# Patient Record
Sex: Female | Born: 1960 | Race: White | Hispanic: No | Marital: Married | Smoking: Never smoker
Health system: Southern US, Community
[De-identification: ages and names within clinical notes are randomized; demographics above are authoritative.]

## PROBLEM LIST (undated history)

## (undated) DIAGNOSIS — M069 Rheumatoid arthritis, unspecified: Secondary | ICD-10-CM

## (undated) DIAGNOSIS — F419 Anxiety disorder, unspecified: Secondary | ICD-10-CM

## (undated) DIAGNOSIS — M329 Systemic lupus erythematosus, unspecified: Secondary | ICD-10-CM

## (undated) DIAGNOSIS — IMO0002 Reserved for concepts with insufficient information to code with codable children: Secondary | ICD-10-CM

## (undated) DIAGNOSIS — E785 Hyperlipidemia, unspecified: Secondary | ICD-10-CM

## (undated) DIAGNOSIS — K589 Irritable bowel syndrome without diarrhea: Secondary | ICD-10-CM

## (undated) DIAGNOSIS — M797 Fibromyalgia: Secondary | ICD-10-CM

## (undated) DIAGNOSIS — I10 Essential (primary) hypertension: Secondary | ICD-10-CM

## (undated) DIAGNOSIS — T4145XA Adverse effect of unspecified anesthetic, initial encounter: Secondary | ICD-10-CM

## (undated) DIAGNOSIS — T8859XA Other complications of anesthesia, initial encounter: Secondary | ICD-10-CM

## (undated) DIAGNOSIS — G8929 Other chronic pain: Secondary | ICD-10-CM

## (undated) DIAGNOSIS — K219 Gastro-esophageal reflux disease without esophagitis: Secondary | ICD-10-CM

## (undated) DIAGNOSIS — D6861 Antiphospholipid syndrome: Secondary | ICD-10-CM

## (undated) DIAGNOSIS — M549 Dorsalgia, unspecified: Secondary | ICD-10-CM

## (undated) DIAGNOSIS — M3500 Sicca syndrome, unspecified: Secondary | ICD-10-CM

## (undated) DIAGNOSIS — G43909 Migraine, unspecified, not intractable, without status migrainosus: Secondary | ICD-10-CM

## (undated) DIAGNOSIS — Z8669 Personal history of other diseases of the nervous system and sense organs: Secondary | ICD-10-CM

## (undated) DIAGNOSIS — IMO0001 Reserved for inherently not codable concepts without codable children: Secondary | ICD-10-CM

## (undated) HISTORY — PX: CERVICAL CERCLAGE: SHX1329

---

## 1987-01-31 HISTORY — PX: CERVICAL CONIZATION W/BX: SHX1330

## 1998-01-19 ENCOUNTER — Other Ambulatory Visit: Admission: RE | Admit: 1998-01-19 | Discharge: 1998-01-19 | Payer: Self-pay | Admitting: *Deleted

## 1999-02-09 ENCOUNTER — Other Ambulatory Visit: Admission: RE | Admit: 1999-02-09 | Discharge: 1999-02-09 | Payer: Self-pay | Admitting: *Deleted

## 2000-02-14 ENCOUNTER — Other Ambulatory Visit: Admission: RE | Admit: 2000-02-14 | Discharge: 2000-02-14 | Payer: Self-pay | Admitting: *Deleted

## 2001-03-25 ENCOUNTER — Other Ambulatory Visit: Admission: RE | Admit: 2001-03-25 | Discharge: 2001-03-25 | Payer: Self-pay | Admitting: *Deleted

## 2002-04-10 ENCOUNTER — Other Ambulatory Visit: Admission: RE | Admit: 2002-04-10 | Discharge: 2002-04-10 | Payer: Self-pay | Admitting: *Deleted

## 2003-05-11 ENCOUNTER — Encounter (INDEPENDENT_AMBULATORY_CARE_PROVIDER_SITE_OTHER): Payer: Self-pay | Admitting: Specialist

## 2003-05-11 ENCOUNTER — Inpatient Hospital Stay (HOSPITAL_COMMUNITY): Admission: RE | Admit: 2003-05-11 | Discharge: 2003-05-13 | Payer: Self-pay | Admitting: Urology

## 2003-05-11 HISTORY — PX: OTHER SURGICAL HISTORY: SHX169

## 2004-05-06 ENCOUNTER — Ambulatory Visit: Payer: Self-pay | Admitting: Internal Medicine

## 2004-05-24 ENCOUNTER — Ambulatory Visit: Payer: Self-pay | Admitting: Internal Medicine

## 2004-08-16 ENCOUNTER — Emergency Department (HOSPITAL_COMMUNITY): Admission: EM | Admit: 2004-08-16 | Discharge: 2004-08-16 | Payer: Self-pay | Admitting: *Deleted

## 2004-10-04 ENCOUNTER — Other Ambulatory Visit: Admission: RE | Admit: 2004-10-04 | Discharge: 2004-10-04 | Payer: Self-pay | Admitting: Obstetrics and Gynecology

## 2004-12-17 ENCOUNTER — Encounter: Admission: RE | Admit: 2004-12-17 | Discharge: 2004-12-17 | Payer: Self-pay | Admitting: Otolaryngology

## 2006-02-13 ENCOUNTER — Emergency Department (HOSPITAL_COMMUNITY): Admission: EM | Admit: 2006-02-13 | Discharge: 2006-02-13 | Payer: Self-pay | Admitting: Emergency Medicine

## 2006-08-21 DIAGNOSIS — G4700 Insomnia, unspecified: Secondary | ICD-10-CM

## 2006-08-21 DIAGNOSIS — K219 Gastro-esophageal reflux disease without esophagitis: Secondary | ICD-10-CM

## 2007-07-10 ENCOUNTER — Ambulatory Visit (HOSPITAL_COMMUNITY): Admission: RE | Admit: 2007-07-10 | Discharge: 2007-07-10 | Payer: Self-pay | Admitting: Urology

## 2007-07-26 ENCOUNTER — Encounter (INDEPENDENT_AMBULATORY_CARE_PROVIDER_SITE_OTHER): Payer: Self-pay | Admitting: Urology

## 2007-07-26 ENCOUNTER — Ambulatory Visit (HOSPITAL_COMMUNITY): Admission: RE | Admit: 2007-07-26 | Discharge: 2007-07-26 | Payer: Self-pay | Admitting: Urology

## 2007-07-26 HISTORY — PX: OTHER SURGICAL HISTORY: SHX169

## 2008-11-05 ENCOUNTER — Encounter: Admission: RE | Admit: 2008-11-05 | Discharge: 2008-11-05 | Payer: Self-pay | Admitting: Obstetrics and Gynecology

## 2009-04-19 ENCOUNTER — Encounter (INDEPENDENT_AMBULATORY_CARE_PROVIDER_SITE_OTHER): Payer: Self-pay | Admitting: *Deleted

## 2009-05-12 ENCOUNTER — Encounter (INDEPENDENT_AMBULATORY_CARE_PROVIDER_SITE_OTHER): Payer: Self-pay | Admitting: *Deleted

## 2009-05-13 ENCOUNTER — Encounter (INDEPENDENT_AMBULATORY_CARE_PROVIDER_SITE_OTHER): Payer: Self-pay | Admitting: *Deleted

## 2009-05-13 ENCOUNTER — Ambulatory Visit: Payer: Self-pay | Admitting: Internal Medicine

## 2009-05-27 ENCOUNTER — Ambulatory Visit: Payer: Self-pay | Admitting: Internal Medicine

## 2009-05-31 ENCOUNTER — Encounter: Payer: Self-pay | Admitting: Internal Medicine

## 2010-03-01 NOTE — Letter (Signed)
Summary: Patient Notice- Polyp Results  [facility]  [address] [place], [place] [address]   Phone: [phone]  Fax: [phone]        May 31, 2009 MRN: [id number]    Sarah Sanford [address] [place], [place]  [address]    Dear 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  Should you develop new or worsening symptoms of abdominal pain, bowel habit changes or bleeding from the rectum or bowels, please schedule an evaluation with either your primary care physician or with me.  Additional information/recommendations:  _x_ No further action with gastroenterology is needed at this time. Please      follow-up with your primary care physician for your other healthcare      needs.  __ Please call [phone] to schedule a return visit to review your      situation.  __ Please keep your follow-up visit as already scheduled.  __ Continue treatment plan as outlined the day of your exam.  Please call us if you are having persistent problems or have questions about your condition that have not been fully answered at this time.  Sincerely,  Hart Carwin MD  This letter has been electronically signed by your physician.  Appended Document: Patient Notice- Polyp Results letter mailed 5.5.11

## 2010-03-01 NOTE — Letter (Signed)
Summary: Miralax Instructions  [facility]  [address]   [place], [place] [address]   Phone: [phone]  Fax: [phone]       Sarah Sanford    [birth year]    MRN: [id number]       Procedure Day Dorna Bloom: Thursday, 05-27-09     Arrival Time: 9:30 a.m.     Procedure Time: 10:30 a.m.     Location of Procedure:                    x    [facility] (4th Floor)   PREPARATION FOR COLONOSCOPY WITH MIRALAX  Starting 5 days prior to your procedure 05-22-09  do not eat nuts, seeds, popcorn, corn, beans, peas,  salads, or any raw vegetables.  Do not take any fiber supplements (e.g. Metamucil, Citrucel, and Benefiber). ____________________________________________________________________________________________________   THE DAY BEFORE YOUR PROCEDURE         DATE: 05-26-09   DAY: Wednesday  1   Drink clear liquids the entire day-NO SOLID FOOD  2   Do not drink anything colored red or purple.  Avoid juices with pulp.  No orange juice.  3   Drink at least 64 oz. (8 glasses) of fluid/clear liquids during the day to prevent dehydration and help the prep work efficiently.  CLEAR LIQUIDS INCLUDE: Water Jello Ice Popsicles Tea (sugar ok, no milk/cream) Powdered fruit flavored drinks Coffee (sugar ok, no milk/cream) Gatorade Juice: apple, white grape, white cranberry  Lemonade Clear bullion, consomm, broth Carbonated beverages (any kind) Strained chicken noodle soup Hard Candy  4   Mix the entire bottle of Miralax with 64 oz. of Gatorade/Powerade in the morning and put in the refrigerator to chill.  5   At 3:00 pm take 2 Dulcolax/Bisacodyl tablets.  6   At 4:30 pm take one Reglan/Metoclopramide tablet.  7  Starting at 5:00 pm drink one 8 oz glass of the Miralax mixture every 15-20 minutes until you have finished drinking the entire 64 oz.  You should finish drinking prep around 7:30 or 8:00 pm.  8   If you are nauseated, you may take the 2nd  Reglan/Metoclopramide tablet at 6:30 pm.        9    At 8:00 pm take 2 more DULCOLAX/Bisacodyl tablets.     THE DAY OF YOUR PROCEDURE      DATE:  05-27-09   DAY: Thursday  You may drink clear liquids until  8:30 a.m.   (2 HOURS BEFORE PROCEDURE).   MEDICATION INSTRUCTIONS  Unless otherwise instructed, you should take regular prescription medications with a small sip of water as early as possible the morning of your procedure.  Diabetic patients - see separate instructions.           OTHER INSTRUCTIONS  You will need a responsible adult at least 51 years of age to accompany you and drive you home.   This person must remain in the waiting room during your procedure.  Wear loose fitting clothing that is easily removed.  Leave jewelry and other valuables at home.  However, you may wish to bring a book to read or an iPod/MP3 player to listen to music as you wait for your procedure to start.  Remove all body piercing jewelry and leave at home.  Total time from sign-in until discharge is approximately 2-3 hours.  You should go home directly after your procedure and rest.  You can resume normal activities the day after your procedure.  The day of your procedure you should not:   Drive   Make legal decisions   Operate machinery   Drink alcohol   Return to work  You will receive specific instructions about eating, activities and medications before you leave.   The above instructions have been reviewed and explained to me by   Ezra Sites RN  May 13, 2009 9:31 AM     I fully understand and can verbalize these instructions _____________________________ Date _______

## 2010-03-01 NOTE — Miscellaneous (Signed)
Summary: [facility]  Clinical Lists Changes  Medications: Added new medication of MIRALAX   POWD (POLYETHYLENE GLYCOL 3350) As per prep  instructions. - Signed Added new medication of DULCOLAX 5 MG  TBEC (BISACODYL) Day before procedure take 2 at 3pm and 2 at 8pm. - Signed Added new medication of REGLAN 10 MG  TABS (METOCLOPRAMIDE HCL) As per prep instructions. - Signed Rx of MIRALAX   POWD (POLYETHYLENE GLYCOL 3350) As per prep  instructions.;  #255gm x 0;  Signed;  Entered by: Ezra Sites RN;  Authorized by: Hart Carwin MD;  Method used: Electronically to [facility] [phone]*, [address], [place], [place], [place]  [address], Ph: [phone], Fax: [phone] Rx of DULCOLAX 5 MG  TBEC (BISACODYL) Day before procedure take 2 at 3pm and 2 at 8pm.;  #4 x 0;  Signed;  Entered by: Ezra Sites RN;  Authorized by: Hart Carwin MD;  Method used: Electronically to [facility] [phone]*, [address] [place], [place], [place]  [address], Ph: [phone], Fax: [phone] Rx of REGLAN 10 MG  TABS (METOCLOPRAMIDE HCL) As per prep instructions.;  #2 x 0;  Signed;  Entered by: Ezra Sites RN;  Authorized by: Hart Carwin MD;  Method used: Electronically to [facility] [phone]*, [address], [place], [place], [place]  [address], Ph: [phone], Fax: [phone] Allergies: Changed allergy or adverse reaction from SULFA to SULFA Changed allergy or adverse reaction from TALWIN to TALWIN    Prescriptions: REGLAN 10 MG  TABS (METOCLOPRAMIDE HCL) As per prep instructions.  #2 x 0   Entered by:   Ezra Sites RN   Authorized by:   Hart Carwin MD   Signed by:   Ezra Sites RN on 05/13/2009   Method used:   Electronically to        [facility] [phone]* (retail)       [address]       [place], [place]  [address]       Ph: [phone]       Fax: [phone]   RxID:   [id number] DULCOLAX 5 MG  TBEC  (BISACODYL) Day before procedure take 2 at 3pm and 2 at 8pm.  #4 x 0   Entered by:   Ezra Sites RN   Authorized by:   Hart Carwin MD   Signed by:   Ezra Sites RN on 05/13/2009   Method used:   Electronically to        [facility] [phone]* (retail)       [address]       [place], [place]  [address]       Ph: [phone]       Fax: [phone]   RxID:   [id number] MIRALAX   POWD (POLYETHYLENE GLYCOL 3350) As per prep  instructions.  #255gm x 0   Entered by:   Ezra Sites RN   Authorized by:   Hart Carwin MD   Signed by:   Ezra Sites RN on 05/13/2009   Method used:   Electronically to        [facility] [phone]* (retail)       [address]       [place], [place]  [address]  Ph: [phone]       Fax: [phone]   RxID:   [id number]

## 2010-03-01 NOTE — Letter (Signed)
Summary: Colonoscopy Letter  [facility]  [address] [place], [place] [address]   Phone: [phone]  Fax: [phone]      April 19, 2009 MRN: [id number]   Sarah Sanford [address] [place], [place]  [address]   Dear Ms. Draeger,   According to your medical record, it is time for you to schedule a Colonoscopy. The American Cancer Society recommends this procedure as a method to detect early colon cancer. Patients with a family history of colon cancer, or a personal history of colon polyps or inflammatory bowel disease are at increased risk.  This letter has beeen generated based on the recommendations made at the time of your procedure. If you feel that in your particular situation this may no longer apply, please contact our office.  Please call our office at [phone] to schedule this appointment or to update your records at your earliest convenience.  Thank you for cooperating with [place] to provide you with the very best care possible.   Sincerely,  Hedwig Morton. Juanda Chance, M.D.  [facility] [phone]

## 2010-03-01 NOTE — Letter (Signed)
Summary: Diabetic Instructions  [facility]  [address] [place], [place] [address]   Phone: [phone]  Fax: [phone]    Sarah Sanford July 22, 1960 MRN: [id number]   _  _   ORAL DIABETIC MEDICATION INSTRUCTIONS  The day before your procedure:   Take your diabetic pill as you do normally  The day of your procedure:   Do not take your diabetic pill    We will check your blood sugar levels during the admission process and again in Recovery before discharging you home  ________________________________________________________________________

## 2010-03-01 NOTE — Procedures (Signed)
Summary: Colonoscopy  Patient: [name] Note: All result statuses are Final unless otherwise noted.  Tests: (1) Colonoscopy (COL)   COL Colonoscopy           DONE     [facility]     [address]     [place], [place]  [address]           COLONOSCOPY PROCEDURE REPORT           PATIENT:  Sarah Sanford, Sarah Sanford  MR#:  [id number]     BIRTHDATE:  [birth year], 49 yrs. old  GENDER:  female     ENDOSCOPIST:  Hedwig Morton. Juanda Chance, MD     REF. BY:  Dr Darlina Rumpf     PROCEDURE DATE:  05/27/2009     PROCEDURE:  Colonoscopy [address]     ASA CLASS:  Class I     INDICATIONS:  adenomatous polyp 2003, no polyp 2006     MEDICATIONS:   Versed 10 mg, Fentanyl 100 mcg           DESCRIPTION OF PROCEDURE:   After the risks benefits and     alternatives of the procedure were thoroughly explained, informed     consent was obtained.  Digital rectal exam was performed and     revealed no rectal masses.   The LB CF-[id number] endoscope     was introduced through the anus and advanced to the cecum, which     was identified by both the appendix and ileocecal valve, without     limitations.  The quality of the prep was good, using MiraLax.     The instrument was then slowly withdrawn as the colon was fully     examined.     <<PROCEDUREIMAGES>>           FINDINGS:  A sessile polyp was found. 4 mm sessile polyp at 55 cm     removed The polyp was removed using cold biopsy forceps (see     image3).  This was otherwise a normal examination of the colon     (see image1, image2, and image4).   Retroflexed views in the     rectum revealed no abnormalities.    The scope was then withdrawn     from the patient and the procedure completed.           COMPLICATIONS:  None     ENDOSCOPIC IMPRESSION:     1) Sessile polyp     2) Otherwise normal examination     RECOMMENDATIONS:     1) Await pathology results     2) High fiber diet.     REPEAT EXAM:  In 5 year(s) for.        ______________________________     Hedwig Morton. Juanda Chance, MD           CC:           n.     eSIGNED:   Hedwig Morton. Melynda Krzywicki at 05/27/2009 11:28 AM           Eloy End, [phone]  Note: An exclamation mark (!) indicates a result that was not dispersed into the flowsheet. Document Creation Date: 05/27/2009 11:29 AM _______________________________________________________________________  (1) Order result status: Final Collection or observation date-time: 05/27/2009 11:20 Requested date-time:  Receipt date-time:  Reported date-time:  Referring Physician:   Ordering Physician: Lina Sar [phone]) Specimen Source:  Source: Launa Grill Order Number: [id number] Lab site:   Appended Document: Colonoscopy  Procedures Next Due Date:    Colonoscopy: 05/2014

## 2010-04-19 LAB — GLUCOSE, CAPILLARY
Glucose-Capillary: 104 mg/dL — ABNORMAL HIGH (ref 70–99)
Glucose-Capillary: 137 mg/dL — ABNORMAL HIGH (ref 70–99)

## 2010-06-14 NOTE — Op Note (Signed)
Sarah Sanford, BILEK              ACCOUNT NO.:  [id number]   MEDICAL RECORD NO.:  [id number]          PATIENT TYPE:  AMB   LOCATION:  DAY                          FACILITY:  [facility]   PHYSICIAN:  Sigmund I. Patsi Sears, M.D.DATE OF BIRTH:  January 09, 1961   DATE OF PROCEDURE:  07/26/2007  DATE OF DISCHARGE:                               OPERATIVE REPORT   PREOPERATIVE DIAGNOSES:  Painful right periurethral mass.   POSTOPERATIVE DIAGNOSES:  Extrusion of right periurethral Mentor  transobturator tape.   OPERATION:  Vaginal exploration, excision of right periurethral Mentor  transobturator tape.   SURGEON:  Sigmund I. Patsi Sears, M.D.   ANESTHESIA:  General LMA.   PREPARATION:  After appropriate preanesthesia, the patient was brought  to the operating room, placed on the operating room table in the dorsal  supine position where general LMA anesthesia was induced.  She was then  replaced in dorsal lithotomy position where the pubis was prepped with  Betadine solution and draped in the usual fashion.   REVIEW OF HISTORY:  This 50 year old female, noted two weeks of fever of  100.5, suprapubic pain, microhematuria.  She was treated with Cipro but  cultures were negative.  The patient has not been sexually active, and  cannot comment on whether this causes pain.  She is status post Mentor  transobturator tape sling in 2005 for stress incontinence.  She has had  complete resolution of her incontinence.  She is a newly diagnosed  diabetic, with a history of clotting abnormality.  She is status post  MRI, which was negative.  Her vaginal examination, however, shows a 10-  point pain and a mass in the right periurethral area.  She is now for  exploration.   PROCEDURE:  The patient is placed in dorsal lithotomy position, and the  rectum appeared normal.  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  A wet 4 x 4  sponge was placed to protect the rectum, and a lubricated vaginal  speculum was placed.  The  patient was placed in the Trendelenburg  position, and Foley catheter was placed.  Vaginal examination revealed  the area of abnormality.  The vaginal epithelium appeared completely  normal.  There was no evidence of extrusion, however, the mass was  palpated, and felt to be scar in the right side of the urethra.  Four mL  of Xylocaine 1% with epinephrine 1:200,000 was then injected over the  area, and using 15 blade, a 2 cm ellipse was accomplished in the  periurethral area.  Dissection was then accomplished in the  subepithelial area, and the mass was identified.  This was grasped  with an Allis clamp, and excised.  No bleeding was noted.  Irrigation  was accomplished with double antibiotic irrigation.   Closure was accomplished with 2-0 Vicryl suture.  Repeat irrigation was  accomplished.  Cystoscopy was accomplished, and showed normal-appearing  bladder mucosa, with clear reflux from both orifices.  Urethra was  normal.  The bladder was drained of fluid.  The Foley catheter was not  replaced.  Vaginal packing was felt not to be necessary.  The  patient  was given IV Toradol.  She was given a B & O suppository at the  beginning of the  procedure.  She was awakened, and taken to recovery room in good  condition.  Note that the patient was previously marked on her right arm  prior to the case, and the patient received IV antibiotic prior to the  case.  In addition, the patient had time-out at the beginning of the  procedure, and again for the specimen, which was sent to pathology.      Sigmund I. Patsi Sears, M.D.  Electronically Signed     SIT/MEDQ  D:  07/26/2007  T:  07/26/2007  Job:  [id number]

## 2010-06-17 NOTE — Op Note (Signed)
NAME:  Sarah Sanford, Sarah Sanford                        ACCOUNT NO.:  [id number]   MEDICAL RECORD NO.:  [id number]                   PATIENT TYPE:  AMB   LOCATION:  DAY                                  FACILITY:  [facility]   PHYSICIAN:  Sigmund I. Patsi Sears, M.D.         DATE OF BIRTH:  07/03/1960   DATE OF PROCEDURE:  05/11/2003  DATE OF DISCHARGE:                                 OPERATIVE REPORT   PREOPERATIVE DIAGNOSIS:  Stress urinary incontinence.   POSTOPERATIVE DIAGNOSIS:  Stress urinary incontinence.   OPERATION:  Implantation of Mentor pubovaginal sling.   SURGEON:  Sigmund I. Patsi Sears, M.D.   ANESTHESIA:  General endotracheal.   PREPARATION:  After appropriate pre-anesthesia, the patient was brought to  the operating room and placed on the operative table in the dorsal supine  position where general endotracheal anesthesia was administered.  She  underwent a hysterectomy and bilateral salpingo-oophorectomy per Guice.  Following this procedure, with the patient in the dorsal lithotomy position,  the pubis was prepped and draped in the usual fashion.   The posterior weighted speculum was placed, and 10 cc of Marcaine 0.25 plain  was injected in the periurethral pubocervical fascial arch tissue.  A 1.5 cm  incision is then made over the urethra, subcutaneous tissue dissected with  sharp and blunt dissection.  Mild to moderate bleeding was noted from this  area.  It is noted that patient has a history of autoimmune coagulopathy and  had been pretreated with heparin.   Following dissection, measurements were taken 5 cm lateral to the clitoris  bilaterally, and area was marked.  Stab wounds were then made over the areas  marked, and the transvaginal, pubovaginal sling was placed through the  obturator fossa.  Tenting was obtained using a right inguinal clamp.  Cystoscopy revealed that there was no evidence of any sling within the  bladder, with both the 12 degree and the 70 degree  lenses.  The bladder was  drained of fluid with Foley catheter, and the wounds closed.  The  perivaginal incisions were closed with Dermabond.  The wound was closed in  two layers with 3-0 Vicryl suture.  Foley catheter was left in place and  vaginal packing was placed because of the history of coagulopathy.  The  patient was awakened and taken to the recovery room in good condition.                                               Sigmund I. Patsi Sears, M.D.   SIT/MEDQ  D:  05/11/2003  T:  05/11/2003  Job:  [id number]   cc:   Tracie Harrier, M.D.  [address] [place]  [place] [address]  Fax: [phone]

## 2010-06-17 NOTE — Op Note (Signed)
NAME:  Sarah Sanford, Sarah Sanford                        ACCOUNT NO.:  [id number]   MEDICAL RECORD NO.:  [id number]                   PATIENT TYPE:  OBV   LOCATION:  1610                                 FACILITY:  [facility]   PHYSICIAN:  Tracie Harrier, M.D.              DATE OF BIRTH:  06-24-1960   DATE OF PROCEDURE:  05/11/2003  DATE OF DISCHARGE:                                 OPERATIVE REPORT   PREOPERATIVE DIAGNOSES:  1. Abnormal uterine bleeding.  2. History of antiphospholipid syndrome.   POSTOPERATIVE DIAGNOSES:  1. Abnormal uterine bleeding.  2. History of antiphospholipid syndrome.   PROCEDURE:  Total vaginal hysterectomy and bilateral salpingo-oophorectomy.   SURGEON:  Tracie Harrier, M.D.   ASSISTANT:  Raynald Kemp, M.D.   ANESTHESIA:  General.   ESTIMATED BLOOD LOSS:  350 mL.   COMPLICATIONS:  None.   FINDINGS:  Normal pelvis.   DESCRIPTION OF PROCEDURE:  The patient was taken to the operating room where  a general endotracheal anesthetic was administered.  The patient was placed  on the operating table in the dorsal lithotomy position, the abdomen,  perineum and vagina was prepped and draped in the usual sterile fashion with  Betadine and sterile drapes.  A weighted speculum was placed and the cervix  was grasped with a Christella Hartigan tenaculum. The posterior cul-de-sac was sharply  and atraumatically entered.  The uterosacral ligaments were then clamped  bilaterally using the LigaSure device and created with cauterization with  the LigaSure device. These pedicles were then divided sharply. A  circumferential incision was made around the anterior portion of the cervix  and the bladder dissected cephalad. The anterior cul-de-sac was then entered  sharply and atraumatically.  A Deaver was placed around the bladder to  ensure its protection during the procedure.  Successive bites were then  carried up the body of the uterus using the LigaSure device. All vascular  pedicles  were thoroughly cauterized and divided sharply. Eventually when the  uterine ovarian ligaments were encountered the fundus of the uterus was  delivered through the posterior aspect of the uterus. The uterine ovarian  ligaments were cauterized once again with the LigaSure device and the uterus  dissected free.  The ovaries were then grasped with Heaney clamps and  retracted into the incision. The infundibulopelvic ligaments were then  grasped bilaterally using the LigaSure device and once again these were  cauterized thoroughly and the ovaries dissected free. Good hemostasis was  noted from the operative areas.  Attention was turned to closure.   The vagina and vaginal cuff was then attached to the uterosacral ligaments  bilaterally using a transfixing suture of #0 Vicryl.  The cul-de-sac was  obliterated by plicating the uterosacral ligament in the midline with a  suture of #0 Vicryl.  Good hemostasis was noted. The vaginal cuff was then  closed in an anterior to posterior fashion with multiple interrupted sutures  of #0 Vicryl. Good hemostasis was noted.  The vagina was closed completely.  The operative field was then prepared for Dr. Patsi Sears and his sling  procedure.   Estimated blood loss was 350 mL.  There were no perioperative complications.  Uterus and ovaries were visualized and noted to be normal. The patient did  receive a preoperative antibiotic.                                               Tracie Harrier, M.D.    REG/MEDQ  D:  05/12/2003  T:  05/12/2003  Job:  [id number]

## 2010-06-17 NOTE — H&P (Signed)
NAME:  Sarah Sanford, Sarah Sanford                        ACCOUNT NO.:  [id number]   MEDICAL RECORD NO.:  [id number]                   PATIENT TYPE:  AMB   LOCATION:  DAY                                  FACILITY:  [facility]   PHYSICIAN:  Tracie Harrier, M.D.              DATE OF BIRTH:  Apr 21, 1960   DATE OF ADMISSION:  DATE OF DISCHARGE:                                HISTORY & PHYSICAL   HISTORY OF PRESENT ILLNESS:  Sarah Sanford is a 50 year old female, gravida 2,  para 2, admitted for total vaginal hysterectomy and elective bilateral  salpingo-oophorectomy due to abnormal uterine bleeding. The patient has had  a long history of abnormal uterine bleeding and now wishes to have  definitive therapy for this. 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 She has been cleared by  her rheumatologist to proceed with this surgery. She declines a more  conservative treatment.   The patient also underwent evaluation urologically and will undergo a sling  procedure for stress urinary incontinence. A long discussion was undertaken  regarding oophorectomy and she requested to undergo oophorectomy. The risks  and benefits as well as alternative therapies discussed with her.   PAST MEDICAL HISTORY:  1. History of Sjogren's syndrome.  2. History of antiphospholipid syndrome.  3. History of cervical dysplasia.  4. History of an atypical colon polyp.   OBSTETRIC HISTORY:  Normal spontaneous vaginal delivery times two at term.   PAST SURGICAL HISTORY:  In 1989, cervical conization and cervical cerclage  times two.   CURRENT MEDICATIONS:  Zoloft, Nexium, Plaquenil, aspirin, Remifemin, and a  multivitamin.   ALLERGIES:  TALWIN, SULFA, and IODINE.   PHYSICAL EXAMINATION:  VITAL SIGNS: Stable, blood pressure 120/80.  GENERAL: She is a well-developed, well-nourished female in no acute  distress.  HEENT: Within normal limits.  NECK: Supple without  adenopathy or thyromegaly.  HEART: Regular rate and rhythm without murmurs, rubs, or gallops.  LUNGS: Clear.  BREASTS:  Exam done recently in the office was normal, however this is  deferred upon admission.  ABDOMEN: Soft and benign without masses, tenderness, hernia, or  organomegaly.  EXTREMITIES/NEUROLOGIC: Grossly normal.  PELVIC EXAM: Normal external female genitalia; vagina and cervix without  abnormal lesion. A small cystourethrocele is noted. The uterus is small,  nontender, and mobile. The adnexae are clear.   ADMISSION DIAGNOSES:  1. Abnormal uterine bleeding.  2. Stress urinary incontinence.   PLAN:  1. Total vaginal hysterectomy and bilateral salpingo-oophorectomy.  2. Followed by a sling procedure for stress urinary incontinence by Dr.     Patsi Sears.   DISCUSSION:  The risks and benefits of this procedure are discussed with the  patient. The risks of bleeding, infection, risks of injury to surrounding  organs are reviewed. Questions are answered regarding this surgery.  Alternative therapies are discussed as well.  Tracie Harrier, M.D.    REG/MEDQ  D:  05/10/2003  T:  05/10/2003  Job:  [id number]

## 2010-10-27 LAB — BASIC METABOLIC PANEL
BUN: 14
CO2: 25
Calcium: 9.3
Chloride: 104
Creatinine, Ser: 0.66
GFR calc Af Amer: >60
GFR calc non Af Amer: >60
Glucose, Bld: 123 — ABNORMAL HIGH
Potassium: 3.9
Sodium: 138

## 2010-10-27 LAB — HEMOGLOBIN AND HEMATOCRIT, BLOOD
HCT: 39.4
Hemoglobin: 13.6

## 2010-10-27 LAB — PROTIME-INR
INR: 1
Prothrombin Time: 13

## 2010-10-27 LAB — APTT: aPTT: 32

## 2010-11-03 ENCOUNTER — Ambulatory Visit (HOSPITAL_BASED_OUTPATIENT_CLINIC_OR_DEPARTMENT_OTHER)
Admission: RE | Admit: 2010-11-03 | Discharge: 2010-11-03 | Disposition: A | Discharge status: Home / Self Care | Payer: BC Managed Care – PPO | Source: Ambulatory Visit | Attending: Urology | Admitting: Urology

## 2010-11-03 ENCOUNTER — Other Ambulatory Visit: Payer: Self-pay | Admitting: Urology

## 2010-11-03 DIAGNOSIS — N8111 Cystocele, midline: Secondary | ICD-10-CM | POA: Insufficient documentation

## 2010-11-03 DIAGNOSIS — R3989 Other symptoms and signs involving the genitourinary system: Secondary | ICD-10-CM | POA: Insufficient documentation

## 2010-11-03 DIAGNOSIS — I10 Essential (primary) hypertension: Secondary | ICD-10-CM | POA: Insufficient documentation

## 2010-11-03 DIAGNOSIS — Z01812 Encounter for preprocedural laboratory examination: Secondary | ICD-10-CM | POA: Insufficient documentation

## 2010-11-03 DIAGNOSIS — IMO0001 Reserved for inherently not codable concepts without codable children: Secondary | ICD-10-CM | POA: Insufficient documentation

## 2010-11-03 DIAGNOSIS — K219 Gastro-esophageal reflux disease without esophagitis: Secondary | ICD-10-CM | POA: Insufficient documentation

## 2010-11-03 DIAGNOSIS — N816 Rectocele: Secondary | ICD-10-CM | POA: Insufficient documentation

## 2010-11-03 DIAGNOSIS — D6859 Other primary thrombophilia: Secondary | ICD-10-CM | POA: Insufficient documentation

## 2010-11-03 DIAGNOSIS — E119 Type 2 diabetes mellitus without complications: Secondary | ICD-10-CM | POA: Insufficient documentation

## 2010-11-03 HISTORY — PX: OTHER SURGICAL HISTORY: SHX169

## 2010-11-03 LAB — GLUCOSE, CAPILLARY: Glucose-Capillary: 115 mg/dL — ABNORMAL HIGH (ref 70–99)

## 2010-11-03 LAB — POCT I-STAT 4, (NA,K, GLUC, HGB,HCT)
Glucose, Bld: 144 mg/dL — ABNORMAL HIGH (ref 70–99)
HCT: 38 % (ref 36.0–46.0)
Hemoglobin: 12.9 g/dL (ref 12.0–15.0)
Potassium: 4 mEq/L (ref 3.5–5.1)
Sodium: 140 mEq/L (ref 135–145)

## 2010-11-04 NOTE — Op Note (Signed)
NAMEHARNOOR, Sarah              ACCOUNT NO.:  [id number]  MEDICAL RECORD NO.:  [id number]  LOCATION:                                 FACILITY:  PHYSICIAN:  Calani Gick I. Patsi Sears, M.D. DATE OF BIRTH:  DATE OF PROCEDURE: DATE OF DISCHARGE:                              OPERATIVE REPORT   PREOPERATIVE DIAGNOSIS:  Right periurethral pain.  POSTOPERATIVE DIAGNOSIS:  Right periurethral pain.  OPERATION:  Right periurethral exploration, excision of Mentor transobturator tape, and placement of Xenform.  SURGEON:  Cheyanna Strick I. Patsi Sears, MD  ANESTHESIA:  General LMA.  PREPARATION:  After appropriate preanesthesia, the patient was brought to the operating room, placed on the operating table in the dorsal supine position where general LMA anesthesia was introduced.  She was then replaced in dorsal lithotomy position __________ prepped with Betadine solution and draped in usual fashion.  BRIEF HISTORY:  Sarah Sanford is a 50 year old female status post Mentor transobturator tape placement for stress urinary incontinence in 2005. The patient did well until 2009, when she had vaginal exploration and removal of tape material.  The patient did well from 2009, but suddenly had onset of right periurethral pain, found on vaginal examination in 2012.  Examination in my office revealed point tenderness in the right periurethral area, in the same location as previous surgical excision of tape.  The patient is now for exploration of the right periurethral area.  Note her past history of antiphospholipid syndrome, the patient will be covered with Lovenox in the recovery room and will have alternating pressure stockings during the procedure.  PROCEDURE IN DETAIL:  With the patient in the dorsal lithotomy position, vaginal examination revealed grade 1 cystocele, grade 2 rectocele.  The urethra did not appear to be hypermobile, and was in normal neutral position.  Foley catheter was placed and clear  urine was obtained.  The vagina had normal estrogenization.  There were no atrophic changes. Examination of the urethra appeared to be normal and I did not palpate periurethral mass.  The Foley catheter was placed, and the bladder neck was marked with a blue pen.  Midurethra incision line was marked with a blue pen as well.  Marcaine 10 cc plus epinephrine 1:200,000 was then injected into the midline around the urethra and into the right periurethral space.  Using a 15 blade, a 2 cm incision was made in the midurethra, subcutaneous tissue was then dissected with the Strully scissors, with great care to avoid any injury to the urethra.  The tissue was dissected into the right lateral sulcus.  Bleeding was controlled with electrosurgical unit.  Using a headlight, I was able to dissect the tissue and find the transobturator tape band.  This was cut at the level of the urethra and then at the level of the obturator fossa.  Irrigation was accomplished.  The tissue was sent to laboratory. This appeared to be a tape material, and not mesh.  Following irrigation, I elected to place Xenform cadaveric/bovine tissue in the dissected space and 2 pieces of 1 cm x 1 square centimeters Xenform was placed in the dissected area.  I then closed the wound with a running 2- 0 Vicryl suture.  This  afforded some pressure inside the wound to stop small bleeding, and no bleeding was noted at the end of the procedure. A urethral inspection showed that the urethra was intact, but somewhat thinned.  I was able to recreate tissue around the urethra.  Packing with Estrace cream was placed, Foley catheter was replaced.  The packing will be removed in recovery room, but I will leave the Foley catheter for several days prior to voiding trial.  The patient was awakened, taken to recovery room, where she will receive subcutaneous injection of Lovenox.     Sarah Sanford I. Patsi Sears, M.D.     SIT/MEDQ  D:  11/03/2010   T:  11/03/2010  Job:  [id number]  Electronically Signed by Jethro Bolus M.D. on 11/04/2010 12:54:56 PM

## 2010-11-25 DIAGNOSIS — M810 Age-related osteoporosis without current pathological fracture: Secondary | ICD-10-CM | POA: Insufficient documentation

## 2010-11-25 DIAGNOSIS — M3500 Sicca syndrome, unspecified: Secondary | ICD-10-CM | POA: Insufficient documentation

## 2010-12-07 DIAGNOSIS — K760 Fatty (change of) liver, not elsewhere classified: Secondary | ICD-10-CM | POA: Insufficient documentation

## 2010-12-07 DIAGNOSIS — F329 Major depressive disorder, single episode, unspecified: Secondary | ICD-10-CM | POA: Insufficient documentation

## 2010-12-07 DIAGNOSIS — Z8679 Personal history of other diseases of the circulatory system: Secondary | ICD-10-CM | POA: Insufficient documentation

## 2010-12-07 DIAGNOSIS — E119 Type 2 diabetes mellitus without complications: Secondary | ICD-10-CM | POA: Insufficient documentation

## 2010-12-07 DIAGNOSIS — F32A Depression, unspecified: Secondary | ICD-10-CM | POA: Insufficient documentation

## 2010-12-07 DIAGNOSIS — Z8719 Personal history of other diseases of the digestive system: Secondary | ICD-10-CM | POA: Insufficient documentation

## 2011-03-07 ENCOUNTER — Other Ambulatory Visit (HOSPITAL_COMMUNITY): Payer: Self-pay | Admitting: Urology

## 2011-03-07 DIAGNOSIS — R102 Pelvic and perineal pain: Secondary | ICD-10-CM

## 2011-03-10 ENCOUNTER — Other Ambulatory Visit: Payer: Self-pay | Admitting: Urology

## 2011-03-13 ENCOUNTER — Inpatient Hospital Stay (HOSPITAL_COMMUNITY)
Admission: RE | Admit: 2011-03-13 | Discharge: 2011-03-13 | Payer: BC Managed Care – PPO | Source: Ambulatory Visit | Attending: Urology | Admitting: Urology

## 2011-03-14 ENCOUNTER — Ambulatory Visit (HOSPITAL_COMMUNITY)
Admission: RE | Admit: 2011-03-14 | Discharge: 2011-03-14 | Disposition: A | Discharge status: Home / Self Care | Payer: BC Managed Care – PPO | Source: Ambulatory Visit | Attending: Urology | Admitting: Urology

## 2011-03-14 DIAGNOSIS — N949 Unspecified condition associated with female genital organs and menstrual cycle: Secondary | ICD-10-CM | POA: Insufficient documentation

## 2011-03-14 DIAGNOSIS — K5730 Diverticulosis of large intestine without perforation or abscess without bleeding: Secondary | ICD-10-CM | POA: Insufficient documentation

## 2011-03-14 DIAGNOSIS — Z90710 Acquired absence of both cervix and uterus: Secondary | ICD-10-CM | POA: Insufficient documentation

## 2011-03-14 DIAGNOSIS — R102 Pelvic and perineal pain: Secondary | ICD-10-CM

## 2011-03-14 DIAGNOSIS — N8111 Cystocele, midline: Secondary | ICD-10-CM | POA: Insufficient documentation

## 2011-03-14 LAB — POCT I-STAT, CHEM 8
BUN: 24 mg/dL — ABNORMAL HIGH (ref 6–23)
Calcium, Ion: 1.27 mmol/L (ref 1.12–1.32)
Chloride: 105 mEq/L (ref 96–112)
Creatinine, Ser: 0.4 mg/dL — ABNORMAL LOW (ref 0.50–1.10)
Glucose, Bld: 157 mg/dL — ABNORMAL HIGH (ref 70–99)
HCT: 41 % (ref 36.0–46.0)
Potassium: 3.9 mEq/L (ref 3.5–5.1)
Sodium: 141 mEq/L (ref 135–145)
TCO2: 25 mmol/L (ref 0–100)

## 2011-03-14 MED ORDER — GADOBENATE DIMEGLUMINE 529 MG/ML IV SOLN
20.0000 mL | Freq: Once | INTRAVENOUS | Status: AC | PRN
  Administered 2011-03-14: 17 mL via INTRAVENOUS

## 2011-03-24 ENCOUNTER — Encounter (HOSPITAL_BASED_OUTPATIENT_CLINIC_OR_DEPARTMENT_OTHER): Payer: Self-pay | Admitting: *Deleted

## 2011-03-27 ENCOUNTER — Encounter (HOSPITAL_BASED_OUTPATIENT_CLINIC_OR_DEPARTMENT_OTHER): Payer: Self-pay | Admitting: *Deleted

## 2011-03-27 NOTE — Progress Notes (Signed)
NPO AFTER MN. ARRIVES AT 0915. NEEDS ISTAT AND EKG. WILL TAKE PLAQUINEL PRILOSEC, AND LIPITOR AM OF SURG W/ SIP OF WATER.

## 2011-03-30 ENCOUNTER — Ambulatory Visit (HOSPITAL_BASED_OUTPATIENT_CLINIC_OR_DEPARTMENT_OTHER): Payer: BC Managed Care – PPO | Admitting: Anesthesiology

## 2011-03-30 ENCOUNTER — Ambulatory Visit (HOSPITAL_BASED_OUTPATIENT_CLINIC_OR_DEPARTMENT_OTHER)
Admission: RE | Admit: 2011-03-30 | Discharge: 2011-03-30 | Disposition: A | Discharge status: Home Health | Payer: BC Managed Care – PPO | Source: Ambulatory Visit | Attending: Urology | Admitting: Urology

## 2011-03-30 ENCOUNTER — Encounter (HOSPITAL_BASED_OUTPATIENT_CLINIC_OR_DEPARTMENT_OTHER): Payer: Self-pay | Admitting: Anesthesiology

## 2011-03-30 ENCOUNTER — Encounter (HOSPITAL_BASED_OUTPATIENT_CLINIC_OR_DEPARTMENT_OTHER)
Admission: RE | Disposition: A | Discharge status: Home Health | Payer: Self-pay | Source: Ambulatory Visit | Attending: Urology

## 2011-03-30 ENCOUNTER — Encounter (HOSPITAL_BASED_OUTPATIENT_CLINIC_OR_DEPARTMENT_OTHER): Payer: Self-pay | Admitting: *Deleted

## 2011-03-30 DIAGNOSIS — N949 Unspecified condition associated with female genital organs and menstrual cycle: Secondary | ICD-10-CM | POA: Insufficient documentation

## 2011-03-30 DIAGNOSIS — N898 Other specified noninflammatory disorders of vagina: Secondary | ICD-10-CM | POA: Insufficient documentation

## 2011-03-30 DIAGNOSIS — Z79899 Other long term (current) drug therapy: Secondary | ICD-10-CM | POA: Insufficient documentation

## 2011-03-30 DIAGNOSIS — Z9889 Other specified postprocedural states: Secondary | ICD-10-CM | POA: Insufficient documentation

## 2011-03-30 DIAGNOSIS — G8929 Other chronic pain: Secondary | ICD-10-CM

## 2011-03-30 DIAGNOSIS — Z90710 Acquired absence of both cervix and uterus: Secondary | ICD-10-CM | POA: Insufficient documentation

## 2011-03-30 DIAGNOSIS — Z7982 Long term (current) use of aspirin: Secondary | ICD-10-CM | POA: Insufficient documentation

## 2011-03-30 DIAGNOSIS — M069 Rheumatoid arthritis, unspecified: Secondary | ICD-10-CM | POA: Insufficient documentation

## 2011-03-30 DIAGNOSIS — D6859 Other primary thrombophilia: Secondary | ICD-10-CM | POA: Insufficient documentation

## 2011-03-30 DIAGNOSIS — M3500 Sicca syndrome, unspecified: Secondary | ICD-10-CM | POA: Insufficient documentation

## 2011-03-30 DIAGNOSIS — K219 Gastro-esophageal reflux disease without esophagitis: Secondary | ICD-10-CM | POA: Insufficient documentation

## 2011-03-30 DIAGNOSIS — E119 Type 2 diabetes mellitus without complications: Secondary | ICD-10-CM | POA: Insufficient documentation

## 2011-03-30 HISTORY — DX: Dorsalgia, unspecified: M54.9

## 2011-03-30 HISTORY — DX: Systemic lupus erythematosus, unspecified: M32.9

## 2011-03-30 HISTORY — DX: Essential (primary) hypertension: I10

## 2011-03-30 HISTORY — DX: Personal history of other diseases of the nervous system and sense organs: Z86.69

## 2011-03-30 HISTORY — DX: Reserved for inherently not codable concepts without codable children: IMO0001

## 2011-03-30 HISTORY — DX: Other complications of anesthesia, initial encounter: T88.59XA

## 2011-03-30 HISTORY — DX: Adverse effect of unspecified anesthetic, initial encounter: T41.45XA

## 2011-03-30 HISTORY — PX: EXAMINATION UNDER ANESTHESIA: SHX1540

## 2011-03-30 HISTORY — DX: Hyperlipidemia, unspecified: E78.5

## 2011-03-30 HISTORY — DX: Reserved for concepts with insufficient information to code with codable children: IMO0002

## 2011-03-30 HISTORY — DX: Fibromyalgia: M79.7

## 2011-03-30 HISTORY — DX: Other chronic pain: G89.29

## 2011-03-30 HISTORY — DX: Sjogren syndrome, unspecified: M35.00

## 2011-03-30 HISTORY — DX: Gastro-esophageal reflux disease without esophagitis: K21.9

## 2011-03-30 HISTORY — DX: Rheumatoid arthritis, unspecified: M06.9

## 2011-03-30 HISTORY — DX: Irritable bowel syndrome, unspecified: K58.9

## 2011-03-30 HISTORY — DX: Anxiety disorder, unspecified: F41.9

## 2011-03-30 HISTORY — DX: Migraine, unspecified, not intractable, without status migrainosus: G43.909

## 2011-03-30 HISTORY — DX: Antiphospholipid syndrome: D68.61

## 2011-03-30 SURGERY — EXAM UNDER ANESTHESIA
Anesthesia: General | Site: Vagina | Wound class: Clean Contaminated

## 2011-03-30 MED ORDER — LACTATED RINGERS IV SOLN
INTRAVENOUS | Status: DC
  Administered 2011-03-30: 10:00:00 via INTRAVENOUS

## 2011-03-30 MED ORDER — SODIUM CHLORIDE 0.9 % IR SOLN
Status: DC | PRN
  Administered 2011-03-30: 11:00:00

## 2011-03-30 MED ORDER — ENOXAPARIN SODIUM 40 MG/0.4ML ~~LOC~~ SOLN
40.0000 mg | Freq: Two times a day (BID) | SUBCUTANEOUS | Status: AC
  Administered 2011-03-30: 40 mg via SUBCUTANEOUS

## 2011-03-30 MED ORDER — FENTANYL CITRATE 0.05 MG/ML IJ SOLN
INTRAMUSCULAR | Status: DC | PRN
  Administered 2011-03-30: 50 ug via INTRAVENOUS
  Administered 2011-03-30 (×2): 25 ug via INTRAVENOUS

## 2011-03-30 MED ORDER — MIDAZOLAM HCL 5 MG/5ML IJ SOLN
INTRAMUSCULAR | Status: DC | PRN
  Administered 2011-03-30: 2 mg via INTRAVENOUS

## 2011-03-30 MED ORDER — DEXAMETHASONE SODIUM PHOSPHATE 4 MG/ML IJ SOLN
INTRAMUSCULAR | Status: DC | PRN
  Administered 2011-03-30: 8 mg via INTRAVENOUS

## 2011-03-30 MED ORDER — BUPIVACAINE-EPINEPHRINE 0.5% -1:200000 IJ SOLN
INTRAMUSCULAR | Status: DC | PRN
  Administered 2011-03-30: 15 mg

## 2011-03-30 MED ORDER — TRIAMCINOLONE ACETONIDE 40 MG/ML IJ SUSP
INTRAMUSCULAR | Status: DC | PRN
  Administered 2011-03-30: 40 mg via INTRAMUSCULAR

## 2011-03-30 MED ORDER — ENOXAPARIN SODIUM 40 MG/0.4ML ~~LOC~~ SOLN: 40.0000 mg | Freq: Once | SUBCUTANEOUS | Status: DC

## 2011-03-30 MED ORDER — KETOROLAC TROMETHAMINE 30 MG/ML IJ SOLN: 15.0000 mg | Freq: Once | INTRAMUSCULAR | Status: DC | PRN

## 2011-03-30 MED ORDER — PROPOFOL 10 MG/ML IV EMUL
INTRAVENOUS | Status: DC | PRN
  Administered 2011-03-30: 170 mg via INTRAVENOUS

## 2011-03-30 MED ORDER — ONDANSETRON HCL 4 MG/2ML IJ SOLN
INTRAMUSCULAR | Status: DC | PRN
  Administered 2011-03-30: 4 mg via INTRAVENOUS

## 2011-03-30 MED ORDER — PROMETHAZINE HCL 25 MG/ML IJ SOLN: 6.2500 mg | INTRAMUSCULAR | Status: DC | PRN

## 2011-03-30 MED ORDER — DROPERIDOL 2.5 MG/ML IJ SOLN
INTRAMUSCULAR | Status: DC | PRN
  Administered 2011-03-30: 0.625 mg via INTRAVENOUS

## 2011-03-30 MED ORDER — CEFAZOLIN SODIUM 1-5 GM-% IV SOLN
1.0000 g | INTRAVENOUS | Status: AC
  Administered 2011-03-30: 1 g via INTRAVENOUS

## 2011-03-30 MED ORDER — HYDROCODONE-ACETAMINOPHEN 7.5-650 MG PO TABS: 1.0000 | ORAL_TABLET | Freq: Four times a day (QID) | ORAL | Status: AC | PRN

## 2011-03-30 MED ORDER — FENTANYL CITRATE 0.05 MG/ML IJ SOLN: 25.0000 ug | INTRAMUSCULAR | Status: DC | PRN

## 2011-03-30 MED ORDER — LIDOCAINE HCL (CARDIAC) 20 MG/ML IV SOLN
INTRAVENOUS | Status: DC | PRN
  Administered 2011-03-30: 80 mg via INTRAVENOUS

## 2011-03-30 SURGICAL SUPPLY — 58 items
ADH SKN CLS APL DERMABOND .7 (GAUZE/BANDAGES/DRESSINGS)
BAG URINE DRAINAGE (UROLOGICAL SUPPLIES) ×3 IMPLANT
BLADE SURG 10 STRL SS (BLADE) ×3 IMPLANT
BLADE SURG 15 STRL LF DISP TIS (BLADE) ×2 IMPLANT
BLADE SURG 15 STRL SS (BLADE) ×3
BLADE SURG ROTATE 9660 (MISCELLANEOUS) ×1 IMPLANT
BOOTIES KNEE HIGH SLOAN (MISCELLANEOUS) ×1 IMPLANT
CANISTER SUCTION 1200CC (MISCELLANEOUS) IMPLANT
CANISTER SUCTION 2500CC (MISCELLANEOUS) ×4 IMPLANT
CATH FOLEY 2WAY SLVR  5CC 16FR (CATHETERS) ×1
CATH FOLEY 2WAY SLVR 5CC 16FR (CATHETERS) ×2 IMPLANT
CLOTH BEACON ORANGE TIMEOUT ST (SAFETY) ×3 IMPLANT
COVER MAYO STAND STRL (DRAPES) ×3 IMPLANT
DERMABOND ADVANCED (GAUZE/BANDAGES/DRESSINGS)
DERMABOND ADVANCED .7 DNX12 (GAUZE/BANDAGES/DRESSINGS) IMPLANT
DISSECTOR BLUNT TIP ENDO 5MM (MISCELLANEOUS) IMPLANT
DISSECTOR ROUND CHERRY 3/8 STR (MISCELLANEOUS) ×1 IMPLANT
DRAIN PENROSE 18X1/2 LTX STRL (DRAIN) IMPLANT
DRAPE CAMERA CLOSED 9X96 (DRAPES) ×3 IMPLANT
DRAPE UNDERBUTTOCKS STRL (DRAPE) ×3 IMPLANT
ELECT BLADE 6.5 .24CM SHAFT (ELECTRODE) ×2 IMPLANT
FLOSEAL 10ML (HEMOSTASIS) IMPLANT
GLOVE BIO SURGEON STRL SZ 6.5 (GLOVE) ×2 IMPLANT
GLOVE BIO SURGEON STRL SZ7.5 (GLOVE) ×5 IMPLANT
GLOVE BIOGEL PI IND STRL 7.5 (GLOVE) ×1 IMPLANT
GLOVE BIOGEL PI INDICATOR 7.5 (GLOVE) ×1
GLOVE ECLIPSE 6.5 STRL STRAW (GLOVE) ×2 IMPLANT
GLOVE ECLIPSE 7.5 STRL STRAW (GLOVE) ×2 IMPLANT
GOWN PREVENTION PLUS LG XLONG (DISPOSABLE) ×3 IMPLANT
GOWN STRL REIN XL XLG (GOWN DISPOSABLE) ×5 IMPLANT
NDL SAFETY ECLIPSE 18X1.5 (NEEDLE) ×1 IMPLANT
NEEDLE HYPO 18GX1.5 SHARP (NEEDLE) ×3
NEEDLE HYPO 22GX1.5 SAFETY (NEEDLE) ×4 IMPLANT
PACKING VAGINAL (PACKING) ×3 IMPLANT
PENCIL BUTTON HOLSTER BLD 10FT (ELECTRODE) ×3 IMPLANT
PLUG CATH AND CAP STER (CATHETERS) ×3 IMPLANT
RETRACTOR LONRSTAR 16.6X16.6CM (MISCELLANEOUS) ×1 IMPLANT
RETRACTOR STAY HOOK 5MM (MISCELLANEOUS) ×1 IMPLANT
RETRACTOR STER APS 16.6X16.6CM (MISCELLANEOUS)
SET IRRIG Y TYPE TUR BLADDER L (SET/KITS/TRAYS/PACK) ×3 IMPLANT
SHEET LAVH (DRAPES) ×3 IMPLANT
SLING SOLYX SYSTEM SIS BX (SLING) IMPLANT
SPONGE LAP 18X18 X RAY DECT (DISPOSABLE) IMPLANT
SPONGE LAP 4X18 X RAY DECT (DISPOSABLE) ×2 IMPLANT
SUCTION FRAZIER TIP 10 FR DISP (SUCTIONS) ×3 IMPLANT
SUT ETHILON 2 0 PS N (SUTURE) IMPLANT
SUT MON AB 2-0 SH 27 (SUTURE)
SUT MON AB 2-0 SH27 (SUTURE) IMPLANT
SUT SILK 3 0 PS 1 (SUTURE) ×3 IMPLANT
SUT VIC AB 2-0 UR6 27 (SUTURE) ×6 IMPLANT
SYR 20CC LL (SYRINGE) ×2 IMPLANT
SYR BULB IRRIGATION 50ML (SYRINGE) ×3 IMPLANT
SYR CONTROL 10ML LL (SYRINGE) ×4 IMPLANT
SYRINGE 10CC LL (SYRINGE) ×3 IMPLANT
TRAY DSU PREP LF (CUSTOM PROCEDURE TRAY) ×3 IMPLANT
TUBE CONNECTING 12X1/4 (SUCTIONS) ×6 IMPLANT
WATER STERILE IRR 500ML POUR (IV SOLUTION) ×4 IMPLANT
YANKAUER SUCT BULB TIP NO VENT (SUCTIONS) IMPLANT

## 2011-03-30 NOTE — Op Note (Signed)
Pre-operative diagnosis : Vaginal pain, with history of Mentor transocular tape extrusion with removal.   Postoperative diagnosis: Same plus vaginal scar  Operation: Examination under anesthesia, incision of vaginal scar, injection of Marcaine 0.25 with epinephrine 1 200,000, and Kenalog in the right periurethral and pubis  Surgeon:  S. Patsi Sears, MD  First assistant: None  Anesthesia:  Gen. LMA  Preparation: After appropriate preanesthesia, and with the patient's armband checked, the patient was brought to the operating room, and placed on the operating table in the dorsal supine position, where general LMA anesthesia was introduced. She was previously given Lovenox, 40 mg, because of her history of antiphospholipid syndrome. She was prepped with soap solution, because of her history of allergy to iodine.  Review history: The patient is status post Mentor transocular tape in 2006 4 stress urinary continence. More recently, the patient had extrusion of mesh, and the mesh was removed. However, the patient has continued to have right-sided pubic pain, and vaginal pain. Inspection the office revealed a small blood clot in the right paravaginal and periurethral area, which could not be removed with a Q-tip. She has since passed this small clot, and is now for examination under anesthesia with the idea that she may have more extruded mesh.  Statement of  Likelihood of Success: Excellent. TIME-OUT observed.:  Procedure: Vaginal speculum was placed after Foley catheter was placed. No extrusion of mesh was identified. The previously identified vaginal abnormality with a clot, was gone. (Patient past clot subsequent to office visit in (. She does have scar within the vagina, and tenting of the scar was thought to possibly produce vaginal pain. Therefore, the scar was incised in 3 areas, and into the scar were cauterized for bleeding. No suture was used. The area of discomfort, as identified in the office,  was injected with Marcaine, epinephrine, and steroid. The vaginal tissue appeared healthy, and no masses were identified. She was awakened, taken to recovery room in good condition.

## 2011-03-30 NOTE — Transfer of Care (Signed)
  Immediate Anesthesia Transfer of Care Note  Patient: Sarah Sanford  Procedure(s) Performed: Procedure(s) (LRB): EXAM UNDER ANESTHESIA (N/A)  Patient Location: PACU  Anesthesia Type: General  Level of Consciousness: awake, oriented, sedated and patient cooperative  Airway & Oxygen Therapy: Patient Spontanous Breathing and Patient connected to face mask oxygen  Post-op Assessment: Report given to PACU RN and Post -op Vital signs reviewed and stable  Post vital signs: Reviewed and stable  Complications: No apparent anesthesia complications

## 2011-03-30 NOTE — Progress Notes (Signed)
Dr Patsi Sears notified of pt allergic to D/C pain med script, to call in new pain med without tylenol to pt's pharmacy

## 2011-03-30 NOTE — Anesthesia Procedure Notes (Signed)
Procedure Name: LMA Insertion Date/Time: 03/30/2011 10:33 AM Performed by: Renella Cunas D Pre-anesthesia Checklist: Patient identified, Emergency Drugs available, Suction available and Patient being monitored Patient Re-evaluated:Patient Re-evaluated prior to inductionOxygen Delivery Method: Circle System Utilized Preoxygenation: Pre-oxygenation with 100% oxygen Intubation Type: IV induction Ventilation: Mask ventilation without difficulty LMA: LMA inserted LMA Size: 4.0 Number of attempts: 1 Airway Equipment and Method: bite block Placement Confirmation: positive ETCO2 Tube secured with: Tape Dental Injury: Teeth and Oropharynx as per pre-operative assessment

## 2011-03-30 NOTE — Anesthesia Preprocedure Evaluation (Signed)
Anesthesia Evaluation  Patient identified by MRN, date of birth, ID band Patient awake    Reviewed: Allergy & Precautions, H&P , NPO status , Patient's Chart, lab work & pertinent test results  History of Anesthesia Complications (+) PROLONGED EMERGENCE  Airway Mallampati: II TM Distance: <3 FB Neck ROM: Full    Dental No notable dental hx.    Pulmonary neg pulmonary ROS,  clear to auscultation  Pulmonary exam normal       Cardiovascular hypertension, Regular Normal    Neuro/Psych Negative Neurological ROS  Negative Psych ROS   GI/Hepatic Neg liver ROS, GERD-  ,  Endo/Other  Negative Endocrine ROSDiabetes mellitus-  Renal/GU negative Renal ROS  Genitourinary negative   Musculoskeletal negative musculoskeletal ROS (+) Fibromyalgia -  Abdominal   Peds negative pediatric ROS (+)  Hematology negative hematology ROS (+)   Anesthesia Other Findings   Reproductive/Obstetrics negative OB ROS                           Anesthesia Physical Anesthesia Plan  ASA: II  Anesthesia Plan: General   Post-op Pain Management:    Induction: Intravenous  Airway Management Planned: LMA  Additional Equipment:   Intra-op Plan:   Post-operative Plan:   Informed Consent: I have reviewed the patients History and Physical, chart, labs and discussed the procedure including the risks, benefits and alternatives for the proposed anesthesia with the patient or authorized representative who has indicated his/her understanding and acceptance.   Dental advisory given  Plan Discussed with: CRNA  Anesthesia Plan Comments:         Anesthesia Quick Evaluation

## 2011-03-30 NOTE — Discharge Instructions (Signed)
Abdominal Pain, Women       Abdominal (stomach, pelvic, or belly) pain can be caused by many things. It is important to tell your doctor:   The location of the pain.   Does it come and go or is it present all the time?   Are there things that start the pain (eating certain foods, exercise)?   Are there other symptoms associated with the pain (fever, nausea, vomiting, diarrhea)?  All of this is helpful to know when trying to find the cause of the pain.   CAUSES   Stomach: virus or bacteria infection, or ulcer.   Intestine: appendicitis (inflamed appendix), regional ileitis (Crohn's disease), ulcerative colitis (inflamed colon), irritable bowel syndrome, diverticulitis (inflamed diverticulum of the colon), or cancer of the stomach or intestine.   Gallbladder disease or stones in the gallbladder.   Kidney disease, kidney stones, or infection.   Pancreas infection or cancer.   Fibromyalgia (pain disorder).   Diseases of the female organs:   Uterus: fibroid (non-cancerous) tumors or infection.   Fallopian tubes: infection or tubal pregnancy.   Ovary: cysts or tumors.   Pelvic adhesions (scar tissue).   Endometriosis (uterus lining tissue growing in the pelvis and on the pelvic organs).   Pelvic congestion syndrome (female organs filling up with blood just before the menstrual period).   Pain with the menstrual period.   Pain with ovulation (producing an egg).   Pain with an IUD (intrauterine device, birth control) in the uterus.   Cancer of the female organs.   Functional pain (pain not caused by a disease, may improve without treatment).   Psychological pain.   Depression.  DIAGNOSIS   Your doctor will decide the seriousness of your pain by doing an examination.   Blood tests.   X-rays.   Ultrasound.   CT scan (computed tomography, special type of X-ray).   MRI (magnetic resonance imaging).   Cultures, for infection.   Barium enema (dye inserted in the large intestine, to better view it with X-rays).   Colonoscopy  (looking in intestine with a lighted tube).   Laparoscopy (minor surgery, looking in abdomen with a lighted tube).   Major abdominal exploratory surgery (looking in abdomen with a large incision).  TREATMENT   The treatment will depend on the cause of the pain.   Many cases can be observed and treated at home.   Over-the-counter medicines recommended by your caregiver.   Prescription medicine.   Antibiotics, for infection.   Birth control pills, for painful periods or for ovulation pain.   Hormone treatment, for endometriosis.   Nerve blocking injections.   Physical therapy.   Antidepressants.   Counseling with a psychologist or psychiatrist.   Minor or major surgery.  HOME CARE INSTRUCTIONS   Do not take laxatives, unless directed by your caregiver.   Take over-the-counter pain medicine only if ordered by your caregiver. Do not take aspirin because it can cause an upset stomach or bleeding.   Try a clear liquid diet (broth or water) as ordered by your caregiver. Slowly move to a bland diet, as tolerated, if the pain is related to the stomach or intestine.   Have a thermometer and take your temperature several times a day, and record it.   Bed rest and sleep, if it helps the pain.   Avoid sexual intercourse, if it causes pain.   Avoid stressful situations.   Keep your follow-up appointments and tests, as your caregiver orders.   If   try:   Acupuncture.   Relaxation exercises (yoga, meditation).   Group therapy.   Counseling.  SEEK MEDICAL CARE IF:   You notice certain foods cause stomach pain.   Your home care treatment is not helping your pain.   You need stronger pain medicine.   You want your IUD removed.   You feel faint or lightheaded.   You develop nausea and vomiting.   You develop a rash.   You are having side effects or  an allergy to your medicine.  SEEK IMMEDIATE MEDICAL CARE IF:   Your pain does not go away or gets worse.   You have a fever.   Your pain is felt only in portions of the abdomen. The right side could possibly be appendicitis. The left lower portion of the abdomen could be colitis or diverticulitis.   You are passing blood in your stools (bright red or black tarry stools, with or without vomiting).   You have blood in your urine.   You develop chills, with or without a fever.   You pass out.  MAKE SURE YOU:   Understand these instructions.   Will watch your condition.   Will get help right away if you are not doing well or get worse.  Document Released: 11/13/2006 Document Revised: 09/28/2010 Document Reviewed: 12/03/2008 [facility] Patient Information 2012 [place], [place].Abdominal Pain, Women Abdominal (stomach, pelvic, or belly) pain can be caused by many things. It is important to tell your doctor:  The location of the pain.   Does it come and go or is it present all the time?   Are there things that start the pain (eating certain foods, exercise)?   Are there other symptoms associated with the pain (fever, nausea, vomiting, diarrhea)?  All of this is helpful to know when trying to find the cause of the pain. CAUSES   Stomach: virus or bacteria infection, or ulcer.   Intestine: appendicitis (inflamed appendix), regional ileitis (Crohn's disease), ulcerative colitis (inflamed colon), irritable bowel syndrome, diverticulitis (inflamed diverticulum of the colon), or cancer of the stomach or intestine.   Gallbladder disease or stones in the gallbladder.   Kidney disease, kidney stones, or infection.   Pancreas infection or cancer.   Fibromyalgia (pain disorder).   Diseases of the female organs:   Uterus: fibroid (non-cancerous) tumors or infection.   Fallopian tubes: infection or tubal pregnancy.   Ovary: cysts or tumors.   Pelvic adhesions (scar tissue).    Endometriosis (uterus lining tissue growing in the pelvis and on the pelvic organs).   Pelvic congestion syndrome (female organs filling up with blood just before the menstrual period).   Pain with the menstrual period.   Pain with ovulation (producing an egg).   Pain with an IUD (intrauterine device, birth control) in the uterus.   Cancer of the female organs.   Functional pain (pain not caused by a disease, may improve without treatment).   Psychological pain.   Depression.  DIAGNOSIS  Your doctor will decide the seriousness of your pain by doing an examination.  Blood tests.   X-rays.   Ultrasound.   CT scan (computed tomography, special type of X-ray).   MRI (magnetic resonance imaging).   Cultures, for infection.   Barium enema (dye inserted in the large intestine, to better view it with X-rays).   Colonoscopy (looking in intestine with a lighted tube).   Laparoscopy (minor surgery, looking in abdomen with a lighted tube).   Major abdominal exploratory surgery (looking  in abdomen with a large incision).  TREATMENT  The treatment will depend on the cause of the pain.   Many cases can be observed and treated at home.   Over-the-counter medicines recommended by your caregiver.   Prescription medicine.   Antibiotics, for infection.   Birth control pills, for painful periods or for ovulation pain.   Hormone treatment, for endometriosis.   Nerve blocking injections.   Physical therapy.   Antidepressants.   Counseling with a psychologist or psychiatrist.   Minor or major surgery.  HOME CARE INSTRUCTIONS   Do not take laxatives, unless directed by your caregiver.   Take over-the-counter pain medicine only if ordered by your caregiver. Do not take aspirin because it can cause an upset stomach or bleeding.   Try a clear liquid diet (broth or water) as ordered by your caregiver. Slowly move to a bland diet, as tolerated, if the pain is related to the  stomach or intestine.   Have a thermometer and take your temperature several times a day, and record it.   Bed rest and sleep, if it helps the pain.   Avoid sexual intercourse, if it causes pain.   Avoid stressful situations.   Keep your follow-up appointments and tests, as your caregiver orders.   If the pain does not go away with medicine or surgery, you may try:   Acupuncture.   Relaxation exercises (yoga, meditation).   Group therapy.   Counseling.  SEEK MEDICAL CARE IF:   You notice certain foods cause stomach pain.   Your home care treatment is not helping your pain.   You need stronger pain medicine.   You want your IUD removed.   You feel faint or lightheaded.   You develop nausea and vomiting.   You develop a rash.   You are having side effects or an allergy to your medicine.  SEEK IMMEDIATE MEDICAL CARE IF:   Your pain does not go away or gets worse.   You have a fever.   Your pain is felt only in portions of the abdomen. The right side could possibly be appendicitis. The left lower portion of the abdomen could be colitis or diverticulitis.   You are passing blood in your stools (bright red or black tarry stools, with or without vomiting).   You have blood in your urine.   You develop chills, with or without a fever.   You pass out.  MAKE SURE YOU:   Understand these instructions.   Will watch your condition.   Will get help right away if you are not doing well or get worse.  Document Released: 11/13/2006 Document Revised: 09/28/2010 Document Reviewed: 12/03/2008 [facility] Patient Information 2012 [place], [place].Abdominal Pain, Women Abdominal (stomach, pelvic, or belly) pain can be caused by many things. It is important to tell your doctor:  The location of the pain.   Does it come and go or is it present all the time?   Are there things that start the pain (eating certain foods, exercise)?   Are there other symptoms associated with the  pain (fever, nausea, vomiting, diarrhea)?  All of this is helpful to know when trying to find the cause of the pain. CAUSES   Stomach: virus or bacteria infection, or ulcer.   Intestine: appendicitis (inflamed appendix), regional ileitis (Crohn's disease), ulcerative colitis (inflamed colon), irritable bowel syndrome, diverticulitis (inflamed diverticulum of the colon), or cancer of the stomach or intestine.   Gallbladder disease or stones in the gallbladder.  Kidney disease, kidney stones, or infection.   Pancreas infection or cancer.   Fibromyalgia (pain disorder).   Diseases of the female organs:   Uterus: fibroid (non-cancerous) tumors or infection.   Fallopian tubes: infection or tubal pregnancy.   Ovary: cysts or tumors.   Pelvic adhesions (scar tissue).   Endometriosis (uterus lining tissue growing in the pelvis and on the pelvic organs).   Pelvic congestion syndrome (female organs filling up with blood just before the menstrual period).   Pain with the menstrual period.   Pain with ovulation (producing an egg).   Pain with an IUD (intrauterine device, birth control) in the uterus.   Cancer of the female organs.   Functional pain (pain not caused by a disease, may improve without treatment).   Psychological pain.   Depression.  DIAGNOSIS  Your doctor will decide the seriousness of your pain by doing an examination.  Blood tests.   X-rays.   Ultrasound.   CT scan (computed tomography, special type of X-ray).   MRI (magnetic resonance imaging).   Cultures, for infection.   Barium enema (dye inserted in the large intestine, to better view it with X-rays).   Colonoscopy (looking in intestine with a lighted tube).   Laparoscopy (minor surgery, looking in abdomen with a lighted tube).   Major abdominal exploratory surgery (looking in abdomen with a large incision).  TREATMENT  The treatment will depend on the cause of the pain.   Many cases can be  observed and treated at home.   Over-the-counter medicines recommended by your caregiver.   Prescription medicine.   Antibiotics, for infection.   Birth control pills, for painful periods or for ovulation pain.   Hormone treatment, for endometriosis.   Nerve blocking injections.   Physical therapy.   Antidepressants.   Counseling with a psychologist or psychiatrist.   Minor or major surgery.  HOME CARE INSTRUCTIONS   Do not take laxatives, unless directed by your caregiver.   Take over-the-counter pain medicine only if ordered by your caregiver. Do not take aspirin because it can cause an upset stomach or bleeding.   Try a clear liquid diet (broth or water) as ordered by your caregiver. Slowly move to a bland diet, as tolerated, if the pain is related to the stomach or intestine.   Have a thermometer and take your temperature several times a day, and record it.   Bed rest and sleep, if it helps the pain.   Avoid sexual intercourse, if it causes pain.   Avoid stressful situations.   Keep your follow-up appointments and tests, as your caregiver orders.   If the pain does not go away with medicine or surgery, you may try:   Acupuncture.   Relaxation exercises (yoga, meditation).   Group therapy.   Counseling.  SEEK MEDICAL CARE IF:   You notice certain foods cause stomach pain.   Your home care treatment is not helping your pain.   You need stronger pain medicine.   You want your IUD removed.   You feel faint or lightheaded.   You develop nausea and vomiting.   You develop a rash.   You are having side effects or an allergy to your medicine.  SEEK IMMEDIATE MEDICAL CARE IF:   Your pain does not go away or gets worse.   You have a fever.   Your pain is felt only in portions of the abdomen. The right side could possibly be appendicitis. The left lower portion of the  abdomen could be colitis or diverticulitis.   You are passing blood in your stools  (bright red or black tarry stools, with or without vomiting).   You have blood in your urine.   You develop chills, with or without a fever.   You pass out.  MAKE SURE YOU:   Understand these instructions.   Will watch your condition.   Will get help right away if you are not doing well or get worse.  Document Released: 11/13/2006 Document Revised: 09/28/2010 Document Reviewed: 12/03/2008 [employer] Patient Information 2012 [employer], [place]Abdominal Pain, Women Abdominal (stomach, pelvic, or belly) pain can be caused by many things. It is important to tell your doctor:  The location of the pain.   Does it come and go or is it present all the time?   Are there things that start the pain (eating certain foods, exercise)?   Are there other symptoms associated with the pain (fever, nausea, vomiting, diarrhea)?  All of this is helpful to know when trying to find the cause of the pain. CAUSES   Stomach: virus or bacteria infection, or ulcer.   Intestine: appendicitis (inflamed appendix), regional ileitis (Crohn's disease), ulcerative colitis (inflamed colon), irritable bowel syndrome, diverticulitis (inflamed diverticulum of the colon), or cancer of the stomach or intestine.   Gallbladder disease or stones in the gallbladder.   Kidney disease, kidney stones, or infection.   Pancreas infection or cancer.   Fibromyalgia (pain disorder).   Diseases of the female organs:   Uterus: fibroid (non-cancerous) tumors or infection.   Fallopian tubes: infection or tubal pregnancy.   Ovary: cysts or tumors.   Pelvic adhesions (scar tissue).   Endometriosis (uterus lining tissue growing in the pelvis and on the pelvic organs).   Pelvic congestion syndrome (female organs filling up with blood just before the menstrual period).   Pain with the menstrual period.   Pain with ovulation (producing an egg).   Pain with an IUD (intrauterine device, birth control) in the uterus.   Cancer of  the female organs.   Functional pain (pain not caused by a disease, may improve without treatment).   Psychological pain.   Depression.  DIAGNOSIS  Your doctor will decide the seriousness of your pain by doing an examination.  Blood tests.   X-rays.   Ultrasound.   CT scan (computed tomography, special type of X-ray).   MRI (magnetic resonance imaging).   Cultures, for infection.   Barium enema (dye inserted in the large intestine, to better view it with X-rays).   Colonoscopy (looking in intestine with a lighted tube).   Laparoscopy (minor surgery, looking in abdomen with a lighted tube).   Major abdominal exploratory surgery (looking in abdomen with a large incision).  TREATMENT  The treatment will depend on the cause of the pain.   Many cases can be observed and treated at home.   Over-the-counter medicines recommended by your caregiver.   Prescription medicine.   Antibiotics, for infection.   Birth control pills, for painful periods or for ovulation pain.   Hormone treatment, for endometriosis.   Nerve blocking injections.   Physical therapy.   Antidepressants.   Counseling with a psychologist or psychiatrist.   Minor or major surgery.  HOME CARE INSTRUCTIONS   Do not take laxatives, unless directed by your caregiver.   Take over-the-counter pain medicine only if ordered by your caregiver. Do not take aspirin because it can cause an upset stomach or bleeding.   Try a clear liquid  diet (broth or water) as ordered by your caregiver. Slowly move to a bland diet, as tolerated, if the pain is related to the stomach or intestine.   Have a thermometer and take your temperature several times a day, and record it.   Bed rest and sleep, if it helps the pain.   Avoid sexual intercourse, if it causes pain.   Avoid stressful situations.   Keep your follow-up appointments and tests, as your caregiver orders.   If the pain does not go away with medicine or  surgery, you may try:   Acupuncture.   Relaxation exercises (yoga, meditation).   Group therapy.   Counseling.  SEEK MEDICAL CARE IF:   You notice certain foods cause stomach pain.   Your home care treatment is not helping your pain.   You need stronger pain medicine.   You want your IUD removed.   You feel faint or lightheaded.   You develop nausea and vomiting.   You develop a rash.   You are having side effects or an allergy to your medicine.  SEEK IMMEDIATE MEDICAL CARE IF:   Your pain does not go away or gets worse.   You have a fever.   Your pain is felt only in portions of the abdomen. The right side could possibly be appendicitis. The left lower portion of the abdomen could be colitis or diverticulitis.   You are passing blood in your stools (bright red or black tarry stools, with or without vomiting).   You have blood in your urine.   You develop chills, with or without a fever.   You pass out.  MAKE SURE YOU:   Understand these instructions.   Will watch your condition.   Will get help right away if you are not doing well or get worse.  Document Released: 11/13/2006 Document Revised: 09/28/2010 Document Reviewed: 12/03/2008 [facility] Patient Information 2012 [place], [place].Abdominal Pain, Women Abdominal (stomach, pelvic, or belly) pain can be caused by many things. It is important to tell your doctor:  The location of the pain.   Does it come and go or is it present all the time?   Are there things that start the pain (eating certain foods, exercise)?   Are there other symptoms associated with the pain (fever, nausea, vomiting, diarrhea)?  All of this is helpful to know when trying to find the cause of the pain. CAUSES   Stomach: virus or bacteria infection, or ulcer.   Intestine: appendicitis (inflamed appendix), regional ileitis (Crohn's disease), ulcerative colitis (inflamed colon), irritable bowel syndrome, diverticulitis (inflamed  diverticulum of the colon), or cancer of the stomach or intestine.   Gallbladder disease or stones in the gallbladder.   Kidney disease, kidney stones, or infection.   Pancreas infection or cancer.   Fibromyalgia (pain disorder).   Diseases of the female organs:   Uterus: fibroid (non-cancerous) tumors or infection.   Fallopian tubes: infection or tubal pregnancy.   Ovary: cysts or tumors.   Pelvic adhesions (scar tissue).   Endometriosis (uterus lining tissue growing in the pelvis and on the pelvic organs).   Pelvic congestion syndrome (female organs filling up with blood just before the menstrual period).   Pain with the menstrual period.   Pain with ovulation (producing an egg).   Pain with an IUD (intrauterine device, birth control) in the uterus.   Cancer of the female organs.   Functional pain (pain not caused by a disease, may improve without treatment).   Psychological  pain.   Depression.  DIAGNOSIS  Your doctor will decide the seriousness of your pain by doing an examination.  Blood tests.   X-rays.   Ultrasound.   CT scan (computed tomography, special type of X-ray).   MRI (magnetic resonance imaging).   Cultures, for infection.   Barium enema (dye inserted in the large intestine, to better view it with X-rays).   Colonoscopy (looking in intestine with a lighted tube).   Laparoscopy (minor surgery, looking in abdomen with a lighted tube).   Major abdominal exploratory surgery (looking in abdomen with a large incision).  TREATMENT  The treatment will depend on the cause of the pain.   Many cases can be observed and treated at home.   Over-the-counter medicines recommended by your caregiver.   Prescription medicine.   Antibiotics, for infection.   Birth control pills, for painful periods or for ovulation pain.   Hormone treatment, for endometriosis.   Nerve blocking injections.   Physical therapy.   Antidepressants.   Counseling  with a psychologist or psychiatrist.   Minor or major surgery.  HOME CARE INSTRUCTIONS   Do not take laxatives, unless directed by your caregiver.   Take over-the-counter pain medicine only if ordered by your caregiver. Do not take aspirin because it can cause an upset stomach or bleeding.   Try a clear liquid diet (broth or water) as ordered by your caregiver. Slowly move to a bland diet, as tolerated, if the pain is related to the stomach or intestine.   Have a thermometer and take your temperature several times a day, and record it.   Bed rest and sleep, if it helps the pain.   Avoid sexual intercourse, if it causes pain.   Avoid stressful situations.   Keep your follow-up appointments and tests, as your caregiver orders.   If the pain does not go away with medicine or surgery, you may try:   Acupuncture.   Relaxation exercises (yoga, meditation).   Group therapy.   Counseling.  SEEK MEDICAL CARE IF:   You notice certain foods cause stomach pain.   Your home care treatment is not helping your pain.   You need stronger pain medicine.   You want your IUD removed.   You feel faint or lightheaded.   You develop nausea and vomiting.   You develop a rash.   You are having side effects or an allergy to your medicine.  SEEK IMMEDIATE MEDICAL CARE IF:   Your pain does not go away or gets worse.   You have a fever.   Your pain is felt only in portions of the abdomen. The right side could possibly be appendicitis. The left lower portion of the abdomen could be colitis or diverticulitis.   You are passing blood in your stools (bright red or black tarry stools, with or without vomiting).   You have blood in your urine.   You develop chills, with or without a fever.   You pass out.  MAKE SURE YOU:   Understand these instructions.   Will watch your condition.   Will get help right away if you are not doing well or get worse.  Document Released: 11/13/2006  Document Revised: 09/28/2010 Document Reviewed: 12/03/2008 [facility] Patient Information 2012 [place], [place].Abdominal Pain, Women Abdominal (stomach, pelvic, or belly) pain can be caused by many things. It is important to tell your doctor:  The location of the pain.   Does it come and go or is it present all  the time?   Are there things that start the pain (eating certain foods, exercise)?   Are there other symptoms associated with the pain (fever, nausea, vomiting, diarrhea)?  All of this is helpful to know when trying to find the cause of the pain. CAUSES   Stomach: virus or bacteria infection, or ulcer.   Intestine: appendicitis (inflamed appendix), regional ileitis (Crohn's disease), ulcerative colitis (inflamed colon), irritable bowel syndrome, diverticulitis (inflamed diverticulum of the colon), or cancer of the stomach or intestine.   Gallbladder disease or stones in the gallbladder.   Kidney disease, kidney stones, or infection.   Pancreas infection or cancer.   Fibromyalgia (pain disorder).   Diseases of the female organs:   Uterus: fibroid (non-cancerous) tumors or infection.   Fallopian tubes: infection or tubal pregnancy.   Ovary: cysts or tumors.   Pelvic adhesions (scar tissue).   Endometriosis (uterus lining tissue growing in the pelvis and on the pelvic organs).   Pelvic congestion syndrome (female organs filling up with blood just before the menstrual period).   Pain with the menstrual period.   Pain with ovulation (producing an egg).   Pain with an IUD (intrauterine device, birth control) in the uterus.   Cancer of the female organs.   Functional pain (pain not caused by a disease, may improve without treatment).   Psychological pain.   Depression.  DIAGNOSIS  Your doctor will decide the seriousness of your pain by doing an examination.  Blood tests.   X-rays.   Ultrasound.   CT scan (computed tomography, special type of X-ray).   MRI  (magnetic resonance imaging).   Cultures, for infection.   Barium enema (dye inserted in the large intestine, to better view it with X-rays).   Colonoscopy (looking in intestine with a lighted tube).   Laparoscopy (minor surgery, looking in abdomen with a lighted tube).   Major abdominal exploratory surgery (looking in abdomen with a large incision).  TREATMENT  The treatment will depend on the cause of the pain.   Many cases can be observed and treated at home.   Over-the-counter medicines recommended by your caregiver.   Prescription medicine.   Antibiotics, for infection.   Birth control pills, for painful periods or for ovulation pain.   Hormone treatment, for endometriosis.   Nerve blocking injections.   Physical therapy.   Antidepressants.   Counseling with a psychologist or psychiatrist.   Minor or major surgery.  HOME CARE INSTRUCTIONS   Do not take laxatives, unless directed by your caregiver.   Take over-the-counter pain medicine only if ordered by your caregiver. Do not take aspirin because it can cause an upset stomach or bleeding.   Try a clear liquid diet (broth or water) as ordered by your caregiver. Slowly move to a bland diet, as tolerated, if the pain is related to the stomach or intestine.   Have a thermometer and take your temperature several times a day, and record it.   Bed rest and sleep, if it helps the pain.   Avoid sexual intercourse, if it causes pain.   Avoid stressful situations.   Keep your follow-up appointments and tests, as your caregiver orders.   If the pain does not go away with medicine or surgery, you may try:   Acupuncture.   Relaxation exercises (yoga, meditation).   Group therapy.   Counseling.  SEEK MEDICAL CARE IF:   You notice certain foods cause stomach pain.   Your home care treatment is not helping  your pain.   You need stronger pain medicine.   You want your IUD removed.   You feel faint or  lightheaded.   You develop nausea and vomiting.   You develop a rash.   You are having side effects or an allergy to your medicine.  SEEK IMMEDIATE MEDICAL CARE IF:   Your pain does not go away or gets worse.   You have a fever.   Your pain is felt only in portions of the abdomen. The right side could possibly be appendicitis. The left lower portion of the abdomen could be colitis or diverticulitis.   You are passing blood in your stools (bright red or black tarry stools, with or without vomiting).   You have blood in your urine.   You develop chills, with or without a fever.   You pass out.  MAKE SURE YOU:   Understand these instructions.   Will watch your condition.   Will get help right away if you are not doing well or get worse.  Document Released: 11/13/2006 Document Revised: 09/28/2010 Document Reviewed: 12/03/2008 ExitCare Patient Information 2012 ExitCare, LAbdominal Pain, Women Abdominal (stomach, pelvic, or belly) pain can be caused by many things. It is important to tell your doctor:  The location of the pain.   Does it come and go or is it present all the time?   Are there things that start the pain (eating certain foods, exercise)?   Are there other symptoms associated with the pain (fever, nausea, vomiting, diarrhea)?  All of this is helpful to know when trying to find the cause of the pain. CAUSES   Stomach: virus or bacteria infection, or ulcer.   Intestine: appendicitis (inflamed appendix), regional ileitis (Crohn's disease), ulcerative colitis (inflamed colon), irritable bowel syndrome, diverticulitis (inflamed diverticulum of the colon), or cancer of the stomach or intestine.   Gallbladder disease or stones in the gallbladder.   Kidney disease, kidney stones, or infection.   Pancreas infection or cancer.   Fibromyalgia (pain disorder).   Diseases of the female organs:   Uterus: fibroid (non-cancerous) tumors or infection.   Fallopian  tubes: infection or tubal pregnancy.   Ovary: cysts or tumors.   Pelvic adhesions (scar tissue).   Endometriosis (uterus lining tissue growing in the pelvis and on the pelvic organs).   Pelvic congestion syndrome (female organs filling up with blood just before the menstrual period).   Pain with the menstrual period.   Pain with ovulation (producing an egg).   Pain with an IUD (intrauterine device, birth control) in the uterus.   Cancer of the female organs.   Functional pain (pain not caused by a disease, may improve without treatment).   Psychological pain.   Depression.  DIAGNOSIS  Your doctor will decide the seriousness of your pain by doing an examination.  Blood tests.   X-rays.   Ultrasound.   CT scan (computed tomography, special type of X-ray).   MRI (magnetic resonance imaging).   Cultures, for infection.   Barium enema (dye inserted in the large intestine, to better view it with X-rays).   Colonoscopy (looking in intestine with a lighted tube).   Laparoscopy (minor surgery, looking in abdomen with a lighted tube).   Major abdominal exploratory surgery (looking in abdomen with a large incision).  TREATMENT  The treatment will depend on the cause of the pain.   Many cases can be observed and treated at home.   Over-the-counter medicines recommended by your caregiver.  Prescription medicine.   Antibiotics, for infection.   Birth control pills, for painful periods or for ovulation pain.   Hormone treatment, for endometriosis.   Nerve blocking injections.   Physical therapy.   Antidepressants.   Counseling with a psychologist or psychiatrist.   Minor or major surgery.  HOME CARE INSTRUCTIONS   Do not take laxatives, unless directed by your caregiver.   Take over-the-counter pain medicine only if ordered by your caregiver. Do not take aspirin because it can cause an upset stomach or bleeding.   Try a clear liquid diet (broth or water)  as ordered by your caregiver. Slowly move to a bland diet, as tolerated, if the pain is related to the stomach or intestine.   Have a thermometer and take your temperature several times a day, and record it.   Bed rest and sleep, if it helps the pain.   Avoid sexual intercourse, if it causes pain.   Avoid stressful situations.   Keep your follow-up appointments and tests, as your caregiver orders.   If the pain does not go away with medicine or surgery, you may try:   Acupuncture.   Relaxation exercises (yoga, meditation).   Group therapy.   Counseling.  SEEK MEDICAL CARE IF:   You notice certain foods cause stomach pain.   Your home care treatment is not helping your pain.   You need stronger pain medicine.   You want your IUD removed.   You feel faint or lightheaded.   You develop nausea and vomiting.   You develop a rash.   You are having side effects or an allergy to your medicine.  SEEK IMMEDIATE MEDICAL CARE IF:   Your pain does not go away or gets worse.   You have a fever.   Your pain is felt only in portions of the abdomen. The right side could possibly be appendicitis. The left lower portion of the abdomen could be colitis or diverticulitis.   You are passing blood in your stools (bright red or black tarry stools, with or without vomiting).   You have blood in your urine.   You develop chills, with or without a fever.   You pass out.  MAKE SURE YOU:   Understand these instructions.   Will watch your condition.   Will get help right away if you are not doing well or get worse.  Document Released: 11/13/2006 Document Revised: 09/28/2010 Document Reviewed: 12/03/2008 ExitCare Patient Information 2012 [employer]LC.LC.Abdominal Pain, Women Abdominal (stomach, pelvic, or belly) pain can be caused by many things. It is important to tell your doctor:  The location of the pain.   Does it come and go or is it present all the time?   Are there  things that start the pain (eating certain foods, exercise)?   Are there other symptoms associated with the pain (fever, nausea, vomiting, diarrhea)?  All of this is helpful to know when trying to find the cause of the pain. CAUSES   Stomach: virus or bacteria infection, or ulcer.   Intestine: appendicitis (inflamed appendix), regional ileitis (Crohn's disease), ulcerative colitis (inflamed colon), irritable bowel syndrome, diverticulitis (inflamed diverticulum of the colon), or cancer of the stomach or intestine.   Gallbladder disease or stones in the gallbladder.   Kidney disease, kidney stones, or infection.   Pancreas infection or cancer.   Fibromyalgia (pain disorder).   Diseases of the female organs:   Uterus: fibroid (non-cancerous) tumors or infection.   Fallopian tubes: infection or  tubal pregnancy.   Ovary: cysts or tumors.   Pelvic adhesions (scar tissue).   Endometriosis (uterus lining tissue growing in the pelvis and on the pelvic organs).   Pelvic congestion syndrome (female organs filling up with blood just before the menstrual period).   Pain with the menstrual period.   Pain with ovulation (producing an egg).   Pain with an IUD (intrauterine device, birth control) in the uterus.   Cancer of the female organs.   Functional pain (pain not caused by a disease, may improve without treatment).   Psychological pain.   Depression.  DIAGNOSIS  Your doctor will decide the seriousness of your pain by doing an examination.  Blood tests.   X-rays.   Ultrasound.   CT scan (computed tomography, special type of X-ray).   MRI (magnetic resonance imaging).   Cultures, for infection.   Barium enema (dye inserted in the large intestine, to better view it with X-rays).   Colonoscopy (looking in intestine with a lighted tube).   Laparoscopy (minor surgery, looking in abdomen with a lighted tube).   Major abdominal exploratory surgery (looking in abdomen  with a large incision).  TREATMENT  The treatment will depend on the cause of the pain.   Many cases can be observed and treated at home.   Over-the-counter medicines recommended by your caregiver.   Prescription medicine.   Antibiotics, for infection.   Birth control pills, for painful periods or for ovulation pain.   Hormone treatment, for endometriosis.   Nerve blocking injections.   Physical therapy.   Antidepressants.   Counseling with a psychologist or psychiatrist.   Minor or major surgery.  HOME CARE INSTRUCTIONS   Do not take laxatives, unless directed by your caregiver.   Take over-the-counter pain medicine only if ordered by your caregiver. Do not take aspirin because it can cause an upset stomach or bleeding.   Try a clear liquid diet (broth or water) as ordered by your caregiver. Slowly move to a bland diet, as tolerated, if the pain is related to the stomach or intestine.   Have a thermometer and take your temperature several times a day, and record it.   Bed rest and sleep, if it helps the pain.   Avoid sexual intercourse, if it causes pain.   Avoid stressful situations.   Keep your follow-up appointments and tests, as your caregiver orders.   If the pain does not go away with medicine or surgery, you may try:   Acupuncture.   Relaxation exercises (yoga, meditation).   Group therapy.   Counseling.  SEEK MEDICAL CARE IF:   You notice certain foods cause stomach pain.   Your home care treatment is not helping your pain.   You need stronger pain medicine.   You want your IUD removed.   You feel faint or lightheaded.   You develop nausea and vomiting.   You develop a rash.   You are having side effects or an allergy to your medicine.  SEEK IMMEDIATE MEDICAL CARE IF:   Your pain does not go away or gets worse.   You have a fever.   Your pain is felt only in portions of the abdomen. The right side could possibly be appendicitis. The  left lower portion of the abdomen could be colitis or diverticulitis.   You are passing blood in your stools (bright red or black tarry stools, with or without vomiting).   You have blood in your urine.   You develop  chills, with or without a fever.   You pass out.  MAKE SURE YOU:   Understand these instructions.   Will watch your condition.   Will get help right away if you are not doing well or get worse.  Document Released: 11/13/2006 Document Revised: 09/28/2010 Document Reviewed: 12/03/2008 [facility] Patient Information 2012 [place], [place].

## 2011-03-30 NOTE — Interval H&P Note (Signed)
History and Physical Interval Note:  03/30/2011 8:10 AM  Sarah Sanford  has presented today for surgery, with the diagnosis of vaginal mesh extrosion   The various methods of treatment have been discussed with the patient and family. After consideration of risks, benefits and other options for treatment, the patient has consented to  Procedure(s) (LRB): PUBO-VAGINAL SLING (N/A) as a surgical intervention .  The patients' history has been reviewed, patient examined, no change in status, stable for surgery.  I have reviewed the patients' chart and labs.  Questions were answered to the patient's satisfaction.     Jethro Bolus I

## 2011-03-30 NOTE — H&P (Signed)
Reason For Visit  Vaginal bleeding & pain   Active Problems Problems  1. Female Pelvic Pain 625.9 2. Vaginal Sling Operation For Stress Incontinence  History of Present Illness         51 YO female with  vaginal pain & bleeding.  Hx of SUI.  She is s/p excision of Mentor OB Tape & placement of Xenoform on 11/03/10.  She has had chronic pelvic pain since the time of her procedure which is a constant, dull pain that is a 2-3/10 on pain scale.  She was last seen by Marcello Fennel, PA on 02/28/11.  Pt states that on Saturday 02/25/11, her elderly mother fell and she had to help pick her up and assist with transfers.   She felt a sharp, stabbing pain in her bladder with lifting her mother but this resolved within an hour or so.  Later that evening, she went to void and noticed bright red blood on the toilet tissue with wiping.  This gradually improved over the following 48hrs and she has not seen any further blood with wiping since early evening 02/27/11.  She did not see gross hematuria in the commode.  She does admit to some mild dysuria and increased frequency and urgency over the past 2 weeks.  She has not been on abx recently.   Post-op Mentor Trans-OB Tape revision 2009.   02/28/11  C&S - negative   Past Medical History Problems  1. 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 History of  Anxiety (Symptom) 300.00 3. History of  Diabetes Mellitus 250.00 4. History of  Esophageal Reflux 530.81 5. History of  Rheumatoid Arthritis 714.0 6. History of  Sjogren's Syndrome 710.2  Surgical History Problems  1. History of  Cervical Surgery (Gyn) 2. History of  Excision Of Vaginal Cyst Or Tumor 3. History of  Hysterectomy 4. History of  Removal/Revision Of Sling For Stress Incontinence 5. History of  Vaginal Sling Operation For Stress Incontinence 6. Vaginal Sling Operation For Stress Incontinence  Current Meds 1. Aspirin 81 MG Oral Tablet; 1 per day; Therapy: (Recorded:18Sep2012)  to 2. Atorvastatin Calcium 10 MG Oral Tablet; 1 per day; Therapy: 24Feb2012 to 3. Benazepril HCl 10 MG Oral Tablet; TAKE 1 TABLET DAILY; Therapy: 24Feb2012 to 4. Fish Oil CAPS; 1 per day; Therapy: (Recorded:18Sep2012) to 5. GlipiZIDE ER 10 MG Oral Tablet Extended Release 24 Hour; Therapy: 07Aug2012 to 6. Losartan Potassium 25 MG Oral Tablet; Therapy: 28Jan2013 to 7. MetFORMIN HCl 500 MG Oral Tablet; TAKE 1 TABLET TWICE DAILY; Therapy:  (Recorded:18Sep2012) to 8. Multiple Vitamin TABS; TAKE 1 TABLET DAILY; Therapy: (Recorded:18Sep2012) to 9. Omeprazole 20 MG Oral Capsule Delayed Release; 1 per day; Therapy: 15Feb2012 to 10. Plaquenil 200 MG Oral Tablet; TAKE 1 TABLET TWICE DAILY; Therapy: (Recorded:18Sep2012) to  Allergies Medication  1. Talwin NX TABS 2. Percocet TABS 3. Sulfa Drugs Non-Medication  4. Shellfish  Family History Problems  1. Maternal history of  Diabetes Mellitus V18.0 2. Family history of  Family Health Status - Father's Age 1 3. Family history of  Family Health Status - Mother's Age 73 4. Family history of  Family Health Status Number Of Children 1 son, 1 daughter 5. Maternal history of  Heart Disease V17.49 6. Maternal history of  Hypertension V17.49 7. Paternal history of  Hypertension V17.49  Social History Problems  1. Alcohol Use 2 per mo 2. Caffeine Use 1 per day 3. Marital History - Currently Married 4. Never A Smoker 5. Occupation: homemaker  Denied  6. History of  Tobacco Use  Review of Systems Constitutional, skin, eye, otolaryngeal, hematologic/lymphatic, pulmonary, endocrine, gastrointestinal and neurological system(s) were reviewed and pertinent findings if present are noted.  Genitourinary: urinary frequency, urinary urgency, dysuria, pelvic pressure and vaginal pain vaginal bleeding. Stress due to taking care of her mother that has early signs of dementia. Her sister has refused to help out with their mother's care.  Psychiatric:  emotional problems.    Vitals Vital Signs [Data Includes: Last 1 Day]  05Feb2013 10:30AM  Blood Pressure: 148 / 92 Temperature: 97.9 F Heart Rate: 96  Physical Exam Genitourinary: Examination of the external genitalia shows normal female external genitalia and no lesions. The urethra is normal in appearance and not tender. There is no urethral mass. Vaginal exam demonstrates no abnormalities and no atrophy. The cervix is is without abnormalities. The adnexa are palpably normal. The bladder is non tender and not distended. The anus is normal on inspection. The perineum is normal on inspection.    Results/Data  Urine [Data Includes: Last 1 Day]   05Feb2013  COLOR YELLOW   APPEARANCE CLEAR   SPECIFIC GRAVITY >1.030   pH 5.5   GLUCOSE > 1000 mg/dL  BILIRUBIN NEG   KETONE TRACE mg/dL  BLOOD MOD   PROTEIN NEG mg/dL  UROBILINOGEN 0.2 mg/dL  NITRITE NEG   LEUKOCYTE ESTERASE NEG   SQUAMOUS EPITHELIAL/HPF RARE   WBC 0-3 WBC/hpf  RBC 0-3 RBC/hpf  BACTERIA RARE   CRYSTALS NONE SEEN   CASTS NONE SEEN   Other TR MUCUS    07 Mar 2011 10:19 AM   UA With REFLEX       COLOR YELLOW       APPEARANCE CLEAR       SPECIFIC GRAVITY >1.030       pH 5.5       GLUCOSE > 1000       BILIRUBIN NEG       KETONE TRACE       BLOOD MOD       PROTEIN NEG       UROBILINOGEN 0.2       NITRITE NEG       LEUKOCYTE ESTERASE NEG       SQUAMOUS EPITHELIAL/HPF RARE       WBC 0-3       CRYSTALS NONE SEEN       CASTS NONE SEEN       RBC 0-3       BACTERIA RARE       Other TR MUCUS    Assessment Assessed  1. 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 Female Pelvic Pain 625.9      Pt has an abnormal area on the R side of the vagina. ? new mesh erosion. She will have examination and possible removal of any mesh. She had trans-ob tape 2005, with 1st problem at 2008. She has apparant vaginal trigger point, and I have discussed it with PT, and MRI ( order as MR pelvis wth  dynamic valsalva imaging. attn: Dr. Loralie Champagne)   Plan    Schedule surgery for removal.  UA With REFLEX  Status: Resulted - Requires Verification  Done: 01Jan0001 12:00AM Ordered Today; For: Health Maintenance (V70.0); Ordered By: Jethro Bolus  Due: 07Feb2013 Marked Important; Last Updated By: Nathaniel Man   Signatures Electronically signed by : Jethro Bolus, M.D.; Mar 07 2011  2:00PM

## 2011-03-31 ENCOUNTER — Encounter (HOSPITAL_BASED_OUTPATIENT_CLINIC_OR_DEPARTMENT_OTHER): Payer: Self-pay | Admitting: Urology

## 2011-03-31 LAB — POCT I-STAT 4, (NA,K, GLUC, HGB,HCT): Glucose, Bld: 120 mg/dL — ABNORMAL HIGH (ref 70–99)

## 2011-03-31 NOTE — Anesthesia Postprocedure Evaluation (Signed)
  Anesthesia Post-op Note  Patient: Sarah Sanford  Procedure(s) Performed: Procedure(s) (LRB): EXAM UNDER ANESTHESIA (N/A)  Patient Location: PACU  Anesthesia Type: General  Level of Consciousness: awake and alert   Airway and Oxygen Therapy: Patient Spontanous Breathing  Post-op Pain: mild  Post-op Assessment: Post-op Vital signs reviewed, Patient's Cardiovascular Status Stable, Respiratory Function Stable, Patent Airway and No signs of Nausea or vomiting  Post-op Vital Signs: stable  Complications: No apparent anesthesia complications

## 2011-04-05 ENCOUNTER — Encounter (HOSPITAL_BASED_OUTPATIENT_CLINIC_OR_DEPARTMENT_OTHER): Payer: Self-pay

## 2012-07-11 DIAGNOSIS — J029 Acute pharyngitis, unspecified: Secondary | ICD-10-CM | POA: Insufficient documentation

## 2012-07-11 DIAGNOSIS — R1310 Dysphagia, unspecified: Secondary | ICD-10-CM | POA: Insufficient documentation

## 2013-05-06 ENCOUNTER — Telehealth: Payer: Self-pay | Admitting: Internal Medicine

## 2013-05-06 NOTE — Telephone Encounter (Signed)
Please try 1% hydrocortisone cream ,apply bid to rectum, 15gm, 1 refill

## 2013-05-06 NOTE — Telephone Encounter (Signed)
Patient states for the last 6 months, she has had dry skin around rectum. She also reports diarrhea. She is diabetic and is well controlled per patient. She is on Metformin. Scheduled with Dr. Juanda ChanceBrodie on 05/20/13 at 9:00 AM.

## 2013-05-07 MED ORDER — HYDROCORTISONE 1 % EX OINT: TOPICAL_OINTMENT | CUTANEOUS | Status: DC

## 2013-05-07 NOTE — Telephone Encounter (Signed)
Patient notified of recommendation. 

## 2013-05-20 ENCOUNTER — Encounter: Payer: Self-pay | Admitting: Internal Medicine

## 2013-05-20 ENCOUNTER — Ambulatory Visit (INDEPENDENT_AMBULATORY_CARE_PROVIDER_SITE_OTHER): Payer: No Typology Code available for payment source | Admitting: Internal Medicine

## 2013-05-20 VITALS — BP 114/76 | HR 88 | Ht 66.5 in | Wt 181.6 lb

## 2013-05-20 DIAGNOSIS — Z860100 Personal history of colon polyps, unspecified: Secondary | ICD-10-CM

## 2013-05-20 DIAGNOSIS — M3500 Sicca syndrome, unspecified: Secondary | ICD-10-CM

## 2013-05-20 DIAGNOSIS — K6289 Other specified diseases of anus and rectum: Secondary | ICD-10-CM

## 2013-05-20 DIAGNOSIS — R197 Diarrhea, unspecified: Secondary | ICD-10-CM

## 2013-05-20 MED ORDER — HYOSCYAMINE SULFATE ER 0.375 MG PO TB12: ORAL_TABLET | ORAL | Status: DC

## 2013-05-20 MED ORDER — LIDOCAINE-HYDROCORTISONE ACE 2-2 % RE KIT: PACK | RECTAL | Status: DC

## 2013-05-20 MED ORDER — FLUCONAZOLE 100 MG PO TABS: 100.0000 mg | ORAL_TABLET | Freq: Every day | ORAL | Status: DC

## 2013-05-20 NOTE — Progress Notes (Signed)
Sarah SeeKristie K Sanford [birth year] [id number]  Note: This dictation was prepared with Dragon digital system. Any transcriptional errors that result from this procedure are unintentional.   History of Present Illness:  This is a 53 year old white female diabetic with diarrhea which has been getting worse over the past 6 months. She has urgent loose stools almost daily with occasional periods of constipation. She is not taking any medication for it. She has tried probiotics. She has been on metformin 500 mg twice a day but the current diarrhea feels different than the drug induced diarrhea. She denies abdominal pain or rectal bleeding. She also has a scaly itchy rash around the rectum. She has a history of pelvic floor dysfunction evaluated [facility] She underwent a total abdominal hysterectomy and BSO in 2005 and bladder tack by Dr. Patsi Searsannenbaum which was revised several times with the last revision in 2013. She has Sjgren syndrome , rheumatoid arthritis  And Anti phosphilipid syndrome for which she takes Plaquenil and aspirin 81 mg a day as well as ibuprofen.     Past Medical History  Diagnosis Date  . Fibromyalgia   . Hypertension   . Hyperlipidemia   . Complication of anesthesia HARD TO WAKE  . History of Bell's palsy BILATERAL X4 IN 2006-- RESOLVED  . RA (rheumatoid arthritis)   . Migraines   . Chronic back pain HX  FX TAILBONE  . Sjoegren syndrome   . GERD (gastroesophageal reflux disease)   . IBS (irritable bowel syndrome)   . Antiphospholipid syndrome   . History of lupus HX POSITIVE BLOODWORK-- IN REMISSION  . Diabetes mellitus   . Anxiety   . Normal cardiac stress test 6 YRS AGO    Past Surgical History  Procedure Laterality Date  . Right periurethral exploration excision mentor transobturator tape/ placement xenform  11-03-2010  . Vaginal expl. excision right periurthral mentor tape  07-26-2007  . Vaginal hysterectomy / bso/ implant mentor pubovaginal  sling  05-11-2003  . Cervical conization w/bx  1989  . Cervical cerclage  X2  . Examination under anesthesia  03/30/2011    Procedure: EXAM UNDER ANESTHESIA;  Surgeon: Kathi LudwigSigmund I Tannenbaum, MD;  [facility];  Service: Urology;  Laterality: N/A;  lysis of vaginal scar    Allergies  Allergen Reactions  . Iodine Shortness Of Breath  . Pentazocine Lactate Other (Sanford Comments)    HALLUCINATION  . Shellfish Allergy Other (Sanford Comments)    RESPIRATORY DISTRESS/ TACHYCARDIA  . Sulfonamide Derivatives Swelling    REACTION: face swells  . Citrus Rash    Family history and social history have been reviewed.  Review of Systems: Negative for dysphagia heartburn abdominal pain  The remainder of the 10 point ROS is negative except as outlined in the H&P  Physical Exam: General Appearance Well developed, in no distress or weight Eyes  Non icteric  HEENT  Non traumatic, normocephalic  Mouth No lesion, tongue papillated, no cheilosis Neck Supple without adenopathy, thyroid not enlarged, no carotid bruits, no JVD Lungs Clear to auscultation bilaterally COR Normal S1, normal S2, regular rhythm, no murmur, quiet precordium Abdomen soft protuberant tender in left upper quadrant left lower quadrant and in epigastrium. Liver edge at costal margin. No ascites. Normal active bowel sounds Rectal and anoscopic exam reveals intensely erythematous area around the anal canal at least 5 cm in diameter. Rectal sphincter tone is normal. There are no hemorrhoids. Stool is soft and Hemoccult negative. Extremities  No  pedal edema Skin No lesions Neurological Alert and oriented x 3 Psychological Normal mood and affect  Assessment and Plan:   Problem #1 Progressive diarrhea. She has autoimmune disease. We need to rule out microscopic colitis. Another possibility is irritable bowel syndrome since she has been under a lot of stress. Another possibility is bacterial overgrowth. She has  already tried probiotics daily. She will begin Levbid 0.375 mg every morning and Benefiber one heaping teaspoon daily.  Problem #2 History of adenomatous polyps in 2003 and 2011. There were no polyps on her 2006 colonoscopy. We will proceed with a colonoscopy at this time and random biopsies to rule out microscopic colitis.  Problem #3 Perianal dermatitis consistent with candida dermatitis. We will start her on Diflucan 100 mg daily x 5 days and have given her samples of a topical steroid with lidocaine for symptomatic relief.      Sarah Sanford 05/20/2013

## 2013-05-20 NOTE — Patient Instructions (Addendum)
You have been scheduled for a colonoscopy with propofol. Please follow written instructions given to you at your visit today.  Please pick up your prep kit at the pharmacy within the next 1-3 days. If you use inhalers (even only as needed), please bring them with you on the day of your procedure. Your physician has requested that you go to www.startemmi.com and enter the access code given to you at your visit today. This web site gives a general overview about your procedure. However, you should still follow specific instructions given to you by our office regarding your preparation for the procedure.  We have sent the following medications to your pharmacy for you to pick up at your convenience: Levbid-Take 1/2-1 tablet by mouth every morning Ana-Lex-Apply to rectum 2-3 times daily Diflucan-Take 1 tablet daily x 5 days  Dr Sol Blazing

## 2013-05-21 ENCOUNTER — Ambulatory Visit (AMBULATORY_SURGERY_CENTER): Payer: No Typology Code available for payment source | Admitting: Internal Medicine

## 2013-05-21 ENCOUNTER — Encounter: Payer: Self-pay | Admitting: Internal Medicine

## 2013-05-21 ENCOUNTER — Other Ambulatory Visit (INDEPENDENT_AMBULATORY_CARE_PROVIDER_SITE_OTHER): Payer: No Typology Code available for payment source

## 2013-05-21 ENCOUNTER — Other Ambulatory Visit: Payer: Self-pay | Admitting: *Deleted

## 2013-05-21 VITALS — BP 138/76 | HR 75 | Temp 97.5°F | Resp 31 | Ht 66.0 in | Wt 181.0 lb

## 2013-05-21 DIAGNOSIS — R197 Diarrhea, unspecified: Secondary | ICD-10-CM

## 2013-05-21 LAB — IGA: IGA: 268 mg/dL (ref 68–378)

## 2013-05-21 LAB — GLUCOSE, CAPILLARY
GLUCOSE-CAPILLARY: 61 mg/dL — AB (ref 70–99)
Glucose-Capillary: 97 mg/dL (ref 70–99)
Glucose-Capillary: 56 mg/dL — ABNORMAL LOW (ref 70–99)
Glucose-Capillary: 75 mg/dL (ref 70–99)
Glucose-Capillary: 110 mg/dL — ABNORMAL HIGH (ref 70–99)

## 2013-05-21 MED ORDER — SODIUM CHLORIDE 0.9 % IV SOLN: 500.0000 mL | INTRAVENOUS | Status: DC

## 2013-05-21 NOTE — Patient Instructions (Signed)
YOU HAD AN ENDOSCOPIC PROCEDURE TODAY AT THE [facility]: Refer to the procedure report that was given to you for any specific questions about what was found during the examination.  If the procedure report does not answer your questions, please call your gastroenterologist to clarify.  If you requested that your care partner not be given the details of your procedure findings, then the procedure report has been included in a sealed envelope for you to review at your convenience later.  YOU SHOULD EXPECT: Some feelings of bloating in the abdomen. Passage of more gas than usual.  Walking can help get rid of the air that was put into your GI tract during the procedure and reduce the bloating. If you had a lower endoscopy (such as a colonoscopy or flexible sigmoidoscopy) you may notice spotting of blood in your stool or on the toilet paper. If you underwent a bowel prep for your procedure, then you may not have a normal bowel movement for a few days.  DIET: Your first meal following the procedure should be a light meal and then it is ok to progress to your normal diet.  A half-sandwich or bowl of soup is an example of a good first meal.  Heavy or fried foods are harder to digest and may make you feel nauseous or bloated.  Likewise meals heavy in dairy and vegetables can cause extra gas to form and this can also increase the bloating.  Drink plenty of fluids but you should avoid alcoholic beverages for 24 hours.  ACTIVITY: Your care partner should take you home directly after the procedure.  You should plan to take it easy, moving slowly for the rest of the day.  You can resume normal activity the day after the procedure however you should NOT DRIVE or use heavy machinery for 24 hours (because of the sedation medicines used during the test).    SYMPTOMS TO REPORT IMMEDIATELY: A gastroenterologist can be reached at any hour.  During normal business hours, 8:30 AM to 5:00 PM Monday through Friday,  call [phone].  After hours and on weekends, please call the GI answering service at [phone] who will take a message and have the physician on call contact you.   Following lower endoscopy (colonoscopy or flexible sigmoidoscopy):  Excessive amounts of blood in the stool  Significant tenderness or worsening of abdominal pains  Swelling of the abdomen that is new, acute  Fever of 100F or higher    FOLLOW UP: If any biopsies were taken you will be contacted by phone or by letter within the next 1-3 weeks.  Call your gastroenterologist if you have not heard about the biopsies in 3 weeks.  Our staff will call the home number listed on your records the next business day following your procedure to check on you and address any questions or concerns that you may have at that time regarding the information given to you following your procedure. This is a courtesy call and so if there is no answer at the home number and we have not heard from you through the emergency physician on call, we will assume that you have returned to your regular daily activities without incident.  SIGNATURES/CONFIDENTIALITY: You and/or your care partner have signed paperwork which will be entered into your electronic medical record.  These signatures attest to the fact that that the information above on your After Visit Summary has been reviewed and is understood.  Full responsibility of the confidentiality   of this discharge information lies with you and/or your care-partner.   Await biopsy results. You may resume your current medications today.  Continue taking Bently and Benefiber. Sprue profile (blood work) today. Please call if any questions or concerns.

## 2013-05-21 NOTE — Op Note (Signed)
[facility] [address] [place], [id number]   COLONOSCOPY PROCEDURE REPORT  PATIENT: Sarah Sanford, Sarah K.  MR#: [id number] BIRTHDATE: [birth year] , 53  yrs. old GENDER: Female ENDOSCOPIST: Hart Carwinora M Kreed Kauffman, MD REFERRED BY:  Dr Theola SequinPhillkip Morrow PROCEDURE DATE:  05/21/2013 PROCEDURE:   Colonoscopy with biopsy ASA CLASS:   Class II INDICATIONS:adenomatous polyps in 2003 and 2011 colonoscopy. No polyps in 2006 colonoscopy. Increasing diarrhea. Patient on metformin. She has Sjgren syndrome and rheumatoid arthritis MEDICATIONS: MAC sedation, administered by CRNA and propofol (Diprivan) 300mg  IV  DESCRIPTION OF PROCEDURE:   After the risks and benefits and of the procedure were explained, informed consent was obtained.  A digital rectal exam revealed no abnormalities of the rectum.    The LB PFC-H190 [id number]  endoscope was introduced through the anus and advanced to the cecum, which was identified by both the appendix and ileocecal valve .  The quality of the prep was good, using MoviPrep .  The instrument was then slowly withdrawn as the colon was fully examined.     COLON FINDINGS: A normal appearing cecum, ileocecal valve, and appendiceal orifice were identified.  The ascending, hepatic flexure, transverse, splenic flexure, descending, sigmoid colon and rectum appeared unremarkable.  No polyps or cancers were seen. Multiple random biopsies of the area were performed. to  rule out microscopic colitis     Retroflexed views revealed no abnormalities.     The scope was then withdrawn from the patient and the procedure completed.  COMPLICATIONS: There were no complications. ENDOSCOPIC IMPRESSION: Normal colon; multiple random biopsies  were performed  to rule out microscopic colitis  RECOMMENDATIONS: 1.  Await biopsy results 2.  continue Bentyl Continue Benefiber Sprue profile today   REPEAT EXAM: In 10 year(s)  for  Colonoscopy.  cc:  _______________________________ eSignedHart Carwin:  Aliah Eriksson M Makayah Pauli, MD 05/21/2013 9:44 AM     PATIENT NAME:  Sarah Sanford, Sarah K. MR#: [id number]

## 2013-05-21 NOTE — Progress Notes (Signed)
No problems noted in the recovery room. maw 

## 2013-05-21 NOTE — Progress Notes (Signed)
Called to room to assist during endoscopic procedure.  Patient ID and intended procedure confirmed with present staff. Received instructions for my participation in the procedure from the performing physician.  

## 2013-05-22 ENCOUNTER — Telehealth: Payer: Self-pay | Admitting: *Deleted

## 2013-05-22 NOTE — Telephone Encounter (Signed)
  Follow up Call-  Call back number 05/21/2013  Post procedure Call Back phone  # [phone]  Permission to leave phone message Yes     Patient questions:  Do you have a fever, pain , or abdominal swelling? no Pain Score  0 *  Have you tolerated food without any problems? yes  Have you been able to return to your normal activities? yes  Do you have any questions about your discharge instructions: Diet   no Medications  no Follow up visit  no  Do you have questions or concerns about your Care? no  Actions: * If pain score is 4 or above: No action needed, pain <4.

## 2013-05-23 LAB — CELIAC PANEL 10
Endomysial Screen: NEGATIVE
GLIADIN IGG: 8.2 U/mL (ref <20)
Gliadin IgA: 5.7 U/mL (ref <20)
IGA: 280 mg/dL (ref 69–380)
Tissue Transglut Ab: 13 U/mL (ref <20)
Tissue Transglutaminase Ab, IgA: 5.2 U/mL (ref <20)

## 2013-05-26 ENCOUNTER — Encounter: Payer: Self-pay | Admitting: Internal Medicine

## 2013-05-27 ENCOUNTER — Encounter: Payer: Self-pay | Admitting: *Deleted

## 2014-07-02 DIAGNOSIS — I152 Hypertension secondary to endocrine disorders: Secondary | ICD-10-CM | POA: Insufficient documentation

## 2014-07-02 DIAGNOSIS — E785 Hyperlipidemia, unspecified: Secondary | ICD-10-CM

## 2014-07-02 DIAGNOSIS — E1169 Type 2 diabetes mellitus with other specified complication: Secondary | ICD-10-CM | POA: Insufficient documentation

## 2014-07-02 DIAGNOSIS — I10 Essential (primary) hypertension: Secondary | ICD-10-CM

## 2014-07-02 DIAGNOSIS — E1159 Type 2 diabetes mellitus with other circulatory complications: Secondary | ICD-10-CM | POA: Insufficient documentation

## 2015-02-23 ENCOUNTER — Encounter: Payer: Self-pay | Admitting: Internal Medicine

## 2015-04-01 ENCOUNTER — Ambulatory Visit (INDEPENDENT_AMBULATORY_CARE_PROVIDER_SITE_OTHER): Payer: BLUE CROSS/BLUE SHIELD | Admitting: Physician Assistant

## 2015-04-01 VITALS — BP 140/90 | HR 109 | Temp 98.1°F | Resp 17 | Ht 65.0 in | Wt 182.0 lb

## 2015-04-01 DIAGNOSIS — R319 Hematuria, unspecified: Secondary | ICD-10-CM | POA: Diagnosis not present

## 2015-04-01 DIAGNOSIS — R300 Dysuria: Secondary | ICD-10-CM | POA: Diagnosis not present

## 2015-04-01 LAB — POCT URINALYSIS DIP (MANUAL ENTRY)
Glucose, UA: 100 — AB
NITRITE UA: NEGATIVE
PH UA: 8.5
Protein Ur, POC: >=300 — AB
SPEC GRAV UA: 1.01

## 2015-04-01 LAB — POC MICROSCOPIC URINALYSIS (UMFC): Mucus: ABSENT

## 2015-04-01 MED ORDER — CIPROFLOXACIN HCL 250 MG PO TABS
500.0000 mg | ORAL_TABLET | Freq: Two times a day (BID) | ORAL | Status: DC
  Administered 2015-04-01: 500 mg via ORAL

## 2015-04-01 NOTE — Addendum Note (Signed)
Addended by: Felix Ahmadi A on: 04/01/2015 09:13 AM   Modules accepted: Kipp Brood

## 2015-04-01 NOTE — Patient Instructions (Addendum)
11:15 - Sarah Sanford You were given 1 dose of cipro 500 mg

## 2015-04-01 NOTE — Progress Notes (Signed)
[facility] [address] [place] [address] [phone]  Date:  04/01/2015   Name:  Sarah Sanford   DOB:  19-Jun-1960   MRN:  [id number]  PCP:  PROVIDER NOT IN SYSTEM    Chief Complaint: Dysuria; Hematuria; and Urinary Frequency   History of Present Illness:  This is a 55 y.o. female with PMH HLD, HTN, DM2, GERD who is presenting with dysuria, hematuria and urinary frequency x 8-12 hours. Having lower abdominal pressure. States noticed when she woke during the night she was having lower abdominal pressure. This morning when she woke she saw small amount of blood in the toilet. Over the past couple hours she has progressed to having large blood in urine. Noticed some mid right back pain last night but no back pain today. Denies n/v, fever, chills. Last UTI 3 years ago. Sexually active with husband - although not in a year.  Bladder sling surgical procedure at same time as TAB hysterectomy with bilateral salpingo-oophorectomy at age 65. Has had a lot of problems with infection from sling and has had to have 3 separate procedures afterwards. Sees Dr. Boyd Kerbs at [facility], has not seen in 2 years.  +antiphospholipid syndrome, +/- lupus, +RA --> first diagnosed with autoimmune issues at age 41.  Review of Systems:  Review of Systems See HPI  Patient Active Problem List   Diagnosis Date Noted  . GERD 08/21/2006  . INSOMNIA 08/21/2006    Prior to Admission medications   Medication Sig Start Date End Date Taking? Authorizing Provider  aspirin 81 MG tablet Take 81 mg by mouth daily.   Yes Historical Provider, MD  atorvastatin (LIPITOR) 10 MG tablet Take 10 mg by mouth every morning.   Yes Historical Provider, MD  Calcium-Magnesium-Vitamin D 600-40-500 MG-MG-UNIT TB24 Take 1 tablet by mouth daily.   Yes Historical Provider, MD  cholecalciferol (VITAMIN D) 1000 UNITS tablet Take 1,000 Units by mouth daily.   Yes Historical Provider, MD  cyclobenzaprine  (FLEXERIL) 10 MG tablet Take 10 mg by mouth every evening.   Yes Historical Provider, MD  diphenhydrAMINE (SOMINEX) 25 MG tablet Take 25 mg by mouth at bedtime as needed. Reported on 04/01/2015   Yes Historical Provider, MD  glipiZIDE (GLUCOTROL) 10 MG tablet Take 10 mg by mouth 2 (two) times daily before a meal.   Yes Historical Provider, MD  hydroxychloroquine (PLAQUENIL) 200 MG tablet Take 200 mg by mouth 2 (two) times daily.   Yes Historical Provider, MD  ibuprofen (ADVIL,MOTRIN) 200 MG tablet Take 200 mg by mouth every 6 (six) hours as needed.   Yes Historical Provider, MD  losartan (COZAAR) 25 MG tablet Take 25 mg by mouth every morning.   Yes Historical Provider, MD  metFORMIN (GLUCOPHAGE) 500 MG tablet Take 500 mg by mouth 2 (two) times daily with a meal.   Yes Historical Provider, MD  Multiple Vitamin (MULTIVITAMIN) tablet Take 1 tablet by mouth daily.   Yes Historical Provider, MD  Omega-3 Fatty Acids (FISH OIL PO) Take by mouth daily.   Yes Historical Provider, MD  omeprazole (PRILOSEC) 20 MG capsule Take 20 mg by mouth every morning.   Yes Historical Provider, MD  vitamin B-12 (CYANOCOBALAMIN) 1000 MCG tablet Take 1,000 mcg by mouth daily.   Yes Historical Provider, MD  vitamin C (ASCORBIC ACID) 500 MG tablet Take 500 mg by mouth daily.   Yes Historical Provider, MD    Allergies  Allergen Reactions  . Iodine  Shortness Of Breath  . Pentazocine Lactate Other (See Comments)    HALLUCINATION  . Shellfish Allergy Other (See Comments)    RESPIRATORY DISTRESS/ TACHYCARDIA  . Sulfonamide Derivatives Swelling    REACTION: face swells  . Citrus Rash    Past Surgical History  Procedure Laterality Date  . Right periurethral exploration excision mentor transobturator tape/ placement xenform  11-03-2010  . Vaginal expl. excision right periurthral mentor tape  07-26-2007  . Vaginal hysterectomy / bso/ implant mentor pubovaginal sling  05-11-2003  . Cervical conization w/bx  1989  .  Cervical cerclage  X2  . Examination under anesthesia  03/30/2011    Procedure: EXAM UNDER ANESTHESIA;  Surgeon: Kathi Ludwig, MD;  Location: [facility];  Service: Urology;  Laterality: N/A;  lysis of vaginal scar    Social History  Substance Use Topics  . Smoking status: Never Smoker   . Smokeless tobacco: Never Used  . Alcohol Use: Yes     Comment: RARE    Family History  Problem Relation Age of Onset  . Colon polyps Mother   . Heart disease Mother     Medication list has been reviewed and updated.  Physical Examination:  Physical Exam  Constitutional: She is oriented to person, place, and time. She appears well-developed and well-nourished. No distress.  HENT:  Head: Normocephalic and atraumatic.  Right Ear: Hearing normal.  Left Ear: Hearing normal.  Nose: Nose normal.  Eyes: Conjunctivae and lids are normal. Right eye exhibits no discharge. Left eye exhibits no discharge. No scleral icterus.  Cardiovascular: Normal rate, regular rhythm, normal heart sounds and normal pulses.   No murmur heard. Pulmonary/Chest: Effort normal and breath sounds normal. No respiratory distress. She has no wheezes. She has no rhonchi. She has no rales.  Abdominal: Soft. Normal appearance. There is tenderness in the suprapubic area. There is no CVA tenderness.  Musculoskeletal: Normal range of motion.  Lymphadenopathy:       Head (right side): No submental, no submandibular and no tonsillar adenopathy present.       Head (left side): No submental, no submandibular and no tonsillar adenopathy present.    She has no cervical adenopathy.  Neurological: She is alert and oriented to person, place, and time.  Skin: Skin is warm, dry and intact. No lesion and no rash noted.  Psychiatric: She has a normal mood and affect. Her speech is normal and behavior is normal. Thought content normal.   BP 140/90 mmHg  Pulse 109  Temp(Src) 98.1 F (36.7 C) (Oral)  Resp 17  Ht 5\' 5"   (1.651 m)  Wt 182 lb (82.555 kg)  BMI 30.29 kg/m2  SpO2 99%  Results for orders placed or performed in visit on 04/01/15  POCT urinalysis dipstick  Result Value Ref Range   Color, UA red (A) yellow   Clarity, UA cloudy (A) clear   Glucose, UA =100 (A) negative   Bilirubin, UA large (A) negative   Ketones, POC UA moderate (40) (A) negative   Spec Grav, UA 1.010    Blood, UA large (A) negative   pH, UA 8.5    Protein Ur, POC >=300 (A) negative   Urobilinogen, UA >=8.0    Nitrite, UA Negative Negative   Leukocytes, UA large (3+) (A) Negative  POCT Microscopic Urinalysis ([facility])  Result Value Ref Range   WBC,UR,HPF,POC Few (A) None WBC/hpf   RBC,UR,HPF,POC Too numerous to count  (A) None RBC/hpf   Bacteria Few (A) None,  Too numerous to count   Mucus Absent Absent   Epithelial Cells, UR Per Microscopy Few (A) None, Too numerous to count cells/hpf    Assessment and Plan:  1. Hematuria 2. Dysuria  Large hematuria progressing quickly over 8-12 hours. +dysuria and lower abdominal pressure. Hx bladder sling operations with several complications, pt stating part of sling is embedded in bladder wall. UA with large wbc and large rbc. Micro -- only rbcs visible. Called [facility] and got pt appt with NP Ginger Organ today at 11:15. Gave 1 dose cipro 500 mg here in clinic.  - POCT urinalysis dipstick - POCT Microscopic Urinalysis ([facility]) - Urine culture  [place] [employer]. Dyke Brackett, MHS [facility]  04/01/2015

## 2015-04-03 LAB — URINE CULTURE: Colony Count: >=100000

## 2016-02-21 ENCOUNTER — Ambulatory Visit (INDEPENDENT_AMBULATORY_CARE_PROVIDER_SITE_OTHER): Payer: BLUE CROSS/BLUE SHIELD | Admitting: Physician Assistant

## 2016-02-21 VITALS — BP 132/76 | HR 132 | Temp 98.1°F | Resp 16 | Ht 65.0 in | Wt 177.0 lb

## 2016-02-21 DIAGNOSIS — R112 Nausea with vomiting, unspecified: Secondary | ICD-10-CM | POA: Diagnosis not present

## 2016-02-21 DIAGNOSIS — R6889 Other general symptoms and signs: Secondary | ICD-10-CM

## 2016-02-21 DIAGNOSIS — R197 Diarrhea, unspecified: Secondary | ICD-10-CM | POA: Diagnosis not present

## 2016-02-21 DIAGNOSIS — K529 Noninfective gastroenteritis and colitis, unspecified: Secondary | ICD-10-CM

## 2016-02-21 LAB — POCT CBC
Granulocyte percent: 84.6 %G — AB (ref 37–80)
HCT, POC: 40.2 % (ref 37.7–47.9)
Hemoglobin: 14.1 g/dL (ref 12.2–16.2)
Lymph, poc: 1 (ref 0.6–3.4)
MCH, POC: 27.6 pg (ref 27–31.2)
MCHC: 35 g/dL (ref 31.8–35.4)
MCV: 78.7 fL — AB (ref 80–97)
MID (cbc): 0.4 (ref 0–0.9)
MPV: 7 fL (ref 0–99.8)
POC Granulocyte: 8 — AB (ref 2–6.9)
POC LYMPH PERCENT: 10.8 %L (ref 10–50)
POC MID %: 4.6 %M (ref 0–12)
Platelet Count, POC: 280 10*3/uL (ref 142–424)
RBC: 5.11 M/uL (ref 4.04–5.48)
RDW, POC: 13.8 %
WBC: 9.5 10*3/uL (ref 4.6–10.2)

## 2016-02-21 LAB — POCT INFLUENZA A/B
Influenza A, POC: NEGATIVE
Influenza B, POC: NEGATIVE

## 2016-02-21 NOTE — Progress Notes (Signed)
Sarah Sanford  MRN: [id number] DOB: 1960/02/01  PCP: PROVIDER NOT IN SYSTEM  Subjective:  Pt is a 56 year old female who presents to clinic for diarrhea, belching, chills, body aches and weakness x 1 days.  Last night she felt "rolling stomach" from 10 pm last night through today. +diarrhea "I stopped counting at 32" bouts of diarrhea. +nauseous, chills. Watery stools, now are "mucusie-yellow".  5/10 constant generalized abdominal pain. Denies vomiting, fever, headache, blood in stools.  Imodium last night, not helping.  No recent antibiotic use. No sick contacts or recent travel. Ate at [employer] last night around 4:30 pm.  She has eaten saltine crackers and some ginger ale.  Diabetes - Sugars today 209. Has not taken medications today.   Review of Systems  Constitutional: Positive for chills. Negative for diaphoresis, fatigue and fever.  HENT: Negative for congestion, postnasal drip, rhinorrhea, sinus pressure, sneezing and sore throat.   Respiratory: Negative for cough, chest tightness, shortness of breath and wheezing.   Cardiovascular: Negative for chest pain and palpitations.  Gastrointestinal: Positive for diarrhea and nausea. Negative for abdominal pain and vomiting.  Neurological: Positive for weakness. Negative for light-headedness and headaches.  Psychiatric/Behavioral: Positive for sleep disturbance.    Patient Active Problem List   Diagnosis Date Noted  . GERD 08/21/2006  . INSOMNIA 08/21/2006    Current Outpatient Prescriptions on File Prior to Visit  Medication Sig Dispense Refill  . aspirin 81 MG tablet Take 81 mg by mouth daily.    Marland Kitchen. atorvastatin (LIPITOR) 10 MG tablet Take 10 mg by mouth every morning.    . Calcium-Magnesium-Vitamin D 600-40-500 MG-MG-UNIT TB24 Take 1 tablet by mouth daily.    . cholecalciferol (VITAMIN D) 1000 UNITS tablet Take 1,000 Units by mouth daily.    . cyclobenzaprine (FLEXERIL) 10 MG tablet Take 10 mg by mouth every evening.      Marland Kitchen. glipiZIDE (GLUCOTROL) 10 MG tablet Take 10 mg by mouth 2 (two) times daily before a meal.    . losartan (COZAAR) 25 MG tablet Take 25 mg by mouth every morning.    . metFORMIN (GLUCOPHAGE) 500 MG tablet Take 500 mg by mouth 2 (two) times daily with a meal.    . Multiple Vitamin (MULTIVITAMIN) tablet Take 1 tablet by mouth daily.    . Omega-3 Fatty Acids (FISH OIL PO) Take by mouth daily.    Marland Kitchen. omeprazole (PRILOSEC) 20 MG capsule Take 20 mg by mouth every morning.    . vitamin B-12 (CYANOCOBALAMIN) 1000 MCG tablet Take 1,000 mcg by mouth daily.    . vitamin C (ASCORBIC ACID) 500 MG tablet Take 500 mg by mouth daily.    . diphenhydrAMINE (SOMINEX) 25 MG tablet Take 25 mg by mouth at bedtime as needed. Reported on 04/01/2015    . hydroxychloroquine (PLAQUENIL) 200 MG tablet Take 200 mg by mouth 2 (two) times daily.    Marland Kitchen. ibuprofen (ADVIL,MOTRIN) 200 MG tablet Take 200 mg by mouth every 6 (six) hours as needed.     No current facility-administered medications on file prior to visit.     Allergies  Allergen Reactions  . Iodine Shortness Of Breath  . Pentazocine Lactate Other (See Comments)    HALLUCINATION  . Shellfish Allergy Other (See Comments)    RESPIRATORY DISTRESS/ TACHYCARDIA  . Sulfonamide Derivatives Swelling    REACTION: face swells  . Citrus Rash     Objective:  BP 132/76 (BP Location: Right Arm, Patient Position: Sitting, Cuff  Size: Small)   Pulse (!) 136   Temp 98.1 F (36.7 C) (Oral)   Resp 16   Ht 5\' 5"  (1.651 m)   Wt 177 lb (80.3 kg)   SpO2 98%   BMI 29.45 kg/m   Physical Exam  Constitutional: She is oriented to person, place, and time and well-developed, well-nourished, and in no distress. No distress.  Cardiovascular: Normal rate, regular rhythm and normal heart sounds.   Abdominal: Soft. Normal appearance. Bowel sounds are hyperactive. There is no hepatosplenomegaly. There is generalized tenderness. There is no rebound, no tenderness at McBurney's point and  negative Murphy's sign.  Neurological: She is alert and oriented to person, place, and time. GCS score is 15.  Skin: Skin is warm and dry.  Psychiatric: Mood, memory, affect and judgment normal.  Vitals reviewed.  Results for orders placed or performed in visit on 02/21/16  POCT Influenza A/B  Result Value Ref Range   Influenza A, POC Negative Negative   Influenza B, POC Negative Negative  POCT CBC  Result Value Ref Range   WBC 9.5 4.6 - 10.2 K/uL   Lymph, poc 1.0 0.6 - 3.4   POC LYMPH PERCENT 10.8 10 - 50 %L   MID (cbc) 0.4 0 - 0.9   POC MID % 4.6 0 - 12 %M   POC Granulocyte 8.0 (A) 2 - 6.9   Granulocyte percent 84.6 (A) 37 - 80 %G   RBC 5.11 4.04 - 5.48 M/uL   Hemoglobin 14.1 12.2 - 16.2 g/dL   HCT, POC 16.1 09.6 - 47.9 %   MCV 78.7 (A) 80 - 97 fL   MCH, POC 27.6 27 - 31.2 pg   MCHC 35.0 31.8 - 35.4 g/dL   RDW, POC 04.5 %   Platelet Count, POC 280 142 - 424 K/uL   MPV 7.0 0 - 99.8 fL     Assessment and Plan :  1. Gastroenteritis 2. Influenza-like symptoms 3. Nausea and vomiting, intractability of vomiting not specified, unspecified vomiting type 4. Diarrhea, unspecified type - POCT Influenza A/B - POCT CBC - Clostridium Difficile by PCR; Future - Stool culture; Future - Suspect food-borne illness. Bland diet encouraged. Stool culture kits sent home with patient, will return them within 24 hours. RTC in 3-5 days if no improvement/worsening symptoms.   Marco Collie, PA-C  [facility] 02/21/2016 3:17 PM

## 2016-02-21 NOTE — Patient Instructions (Addendum)
Please try to stay well hydrated. See below for dietary restrictions for the next few days.  Bring back your stool samples when you can. No appointment is needed for this, you can drop them off.  Come back in 5-7 days if you are not better.  Thank you for coming in today. I hope you feel we met your needs.  Feel free to call [facility] if you have any questions or further requests.  Please consider signing up for MyChart if you do not already have it, as this is a great way to communicate with me.  Best,  Whitney McVey, PA-C   Bland Diet Introduction A bland diet consists of foods that do not have a lot of fat or fiber. Foods without fat or fiber are easier for the body to digest. They are also less likely to irritate your mouth, throat, stomach, and other parts of your gastrointestinal tract. A bland diet is sometimes called a BRAT diet. What is my plan? Your health care provider or dietitian may recommend specific changes to your diet to prevent and treat your symptoms, such as:  Eating small meals often.  Cooking food until it is soft enough to chew easily.  Chewing your food well.  Drinking fluids slowly.  Not eating foods that are very spicy, sour, or fatty.  Not eating citrus fruits, such as oranges and grapefruit. What do I need to know about this diet?  Eat a variety of foods from the bland diet food list.  Do not follow a bland diet longer than you have to.  Ask your health care provider whether you should take vitamins. What foods can I eat? Grains  Hot cereals, such as cream of wheat. Bread, crackers, or tortillas made from refined white flour. Rice. Vegetables  Canned or cooked vegetables. Mashed or boiled potatoes. Fruits  Bananas. Applesauce. Other types of cooked or canned fruit with the skin and seeds removed, such as canned peaches or pears. Meats and Other Protein Sources  Scrambled eggs. Creamy peanut butter or other nut butters. Lean, well-cooked meats, such  as chicken or fish. Tofu. Soups or broths. Dairy  Low-fat dairy products, such as milk, cottage cheese, or yogurt. Beverages  Water. Herbal tea. Apple juice. Sweets and Desserts  Pudding. Custard. Fruit gelatin. Ice cream. Fats and Oils  Mild salad dressings. Canola or olive oil. The items listed above may not be a complete list of allowed foods or beverages. Contact your dietitian for more options.  What foods are not recommended? Foods and ingredients that are often not recommended include:  Spicy foods, such as hot sauce or salsa.  Fried foods.  Sour foods, such as pickled or fermented foods.  Raw vegetables or fruits, especially citrus or berries.  Caffeinated drinks.  Alcohol.  Strongly flavored seasonings or condiments. The items listed above may not be a complete list of foods and beverages that are not allowed. Contact your dietitian for more information.  This information is not intended to replace advice given to you by your health care provider. Make sure you discuss any questions you have with your health care provider. Document Released: 05/10/2015 Document Revised: 06/24/2015 Document Reviewed: 01/28/2014  2017 Elsevier  IF you received an x-ray today, you will receive an invoice from [facility]. Please contact [facility] at [phone] with questions or concerns regarding your invoice.   IF you received labwork today, you will receive an invoice from [facility]. Please contact [facility] at [phone] with questions or concerns  regarding your invoice.   Our billing staff will not be able to assist you with questions regarding bills from these companies.  You will be contacted with the lab results as soon as they are available. The fastest way to get your results is to activate your My Chart account. Instructions are located on the last page of this paperwork. If you have not heard from [place] regarding the results in 2 weeks, please contact this  office.

## 2016-12-13 ENCOUNTER — Ambulatory Visit (INDEPENDENT_AMBULATORY_CARE_PROVIDER_SITE_OTHER): Payer: BLUE CROSS/BLUE SHIELD

## 2016-12-13 ENCOUNTER — Other Ambulatory Visit: Payer: Self-pay

## 2016-12-13 ENCOUNTER — Ambulatory Visit: Payer: BLUE CROSS/BLUE SHIELD | Admitting: Physician Assistant

## 2016-12-13 ENCOUNTER — Encounter: Payer: Self-pay | Admitting: Physician Assistant

## 2016-12-13 VITALS — BP 160/94 | HR 109 | Temp 98.4°F | Resp 16 | Ht 65.5 in | Wt 179.8 lb

## 2016-12-13 DIAGNOSIS — R05 Cough: Secondary | ICD-10-CM

## 2016-12-13 DIAGNOSIS — R059 Cough, unspecified: Secondary | ICD-10-CM

## 2016-12-13 DIAGNOSIS — J209 Acute bronchitis, unspecified: Secondary | ICD-10-CM | POA: Diagnosis not present

## 2016-12-13 MED ORDER — HYDROCODONE-HOMATROPINE 5-1.5 MG/5ML PO SYRP: 5.0000 mL | ORAL_SOLUTION | Freq: Three times a day (TID) | ORAL | 0 refills | Status: AC | PRN

## 2016-12-13 MED ORDER — BENZONATATE 100 MG PO CAPS: 100.0000 mg | ORAL_CAPSULE | Freq: Three times a day (TID) | ORAL | 0 refills | Status: AC | PRN

## 2016-12-13 MED ORDER — AZITHROMYCIN 250 MG PO TABS: ORAL_TABLET | ORAL | 0 refills | Status: AC

## 2016-12-13 NOTE — Patient Instructions (Addendum)
Stay well hydrated. Get lost of rest. Come back in 5-7 days if you are not improving.   Advil or ibuprofen for pain. Do not take Aspirin.  Throat lozenges (if you are not at risk for choking) or sprays may be used to soothe your throat. Drink enough water and fluids to keep your urine clear or pale yellow. For sore throat: ? Gargle with 8 oz of salt water ( tsp of salt per 1 qt of water) as often as every 1-2 hours to soothe your throat.  Gargle liquid benadryl.  Use Elderberry syrup.   For sore throat try using a honey-based tea. Use 3 teaspoons of honey with juice squeezed from half lemon. Place shaved pieces of ginger into 1/2-1 cup of water and warm over stove top. Then mix the ingredients and repeat every 4 hours as needed.  Cough Syrup Recipe: Sweet Lemon & Honey Thyme  Ingredients a handful of fresh thyme sprigs   1 pint of water (2 cups)  1/2 cup honey (raw is best, but regular will do)  1/2 lemon chopped Instructions 1. Place the lemon in the pint jar and cover with the honey. The honey will macerate the lemons and draw out liquids which taste so delicious! 2. Meanwhile, toss the thyme leaves into a saucepan and cover them with the water. 3. Bring the water to a gentle simmer and reduce it to half, about a cup of tea. 4. When the tea is reduced and cooled a bit, strain the sprigs & leaves, add it into the pint jar and stir it well. 5. Give it a shake and use a spoonful as needed. 6. Store your homemade cough syrup in the refrigerator for about a month.  What causes a cough? In adults, common causes of a cough include: ?An infection of the airways or lungs (such as the common cold) ?Postnasal drip - Postnasal drip is when mucus from the nose drips down or flows along the back of the throat. Postnasal drip can happen when people have: .A cold .Allergies .A sinus infection - The sinuses are hollow areas in the bones of the face that open into the nose. ?Lung conditions,  like asthma and chronic obstructive pulmonary disease (COPD) - Both of these conditions can make it hard to breathe. COPD is usually caused by smoking. ?Acid reflux - Acid reflux is when the acid that is normally in your stomach backs up into your esophagus (the tube that carries food from your mouth to your stomach). ?A side effect from blood pressure medicines called "ACE inhibitors" ?Smoking cigarettes  Is there anything I can do on my own to get rid of my cough? Yes. To help get rid of your cough, you can: ?Use a humidifier in your bedroom ?Use an over-the-counter cough medicine, or suck on cough drops or hard candy ?Stop smoking, if you smoke ?If you have allergies, avoid the things you are allergic to (like pollen, dust, animals, or mold) If you have acid reflux, your doctor or nurse will tell you which lifestyle changes can help reduce symptoms.  Thank you for coming in today. I hope you feel we met your needs.  Feel free to call PCP if you have any questions or further requests.  Please consider signing up for MyChart if you do not already have it, as this is a great way to communicate with me.  Best,  Whitney McVey, PA-C   IF you received an x-ray today, you will receive an  invoice from [facility]. Please contact [facility] at [phone] with questions or concerns regarding your invoice.   IF you received labwork today, you will receive an invoice from [facility]. Please contact [facility] at [phone] with questions or concerns regarding your invoice.   Our billing staff will not be able to assist you with questions regarding bills from these companies.  You will be contacted with the lab results as soon as they are available. The fastest way to get your results is to activate your My Chart account. Instructions are located on the last page of this paperwork. If you have not heard from [place] regarding the results in 2 weeks, please contact this office.

## 2017-02-01 NOTE — Progress Notes (Signed)
Sarah Sanford  MRN: [id number] DOB: 1960-05-09  PCP: System, Provider Not In  Subjective:  Pt is a 57 year old female PMH HTN, DM, GERD, NAFLD, HLD, depression who presents to clinic for worsening cough x 9 days. Cough is productive and is keeping her up at night +night sweats. She denies fever, chest pain, palpitations, wheezing, shob, doe.  She has taken otc medications - not helping. No known sick contacts.   Review of Systems  Constitutional: Positive for chills. Negative for diaphoresis, fatigue and fever.  HENT: Positive for congestion. Negative for sinus pressure, sinus pain and sore throat.   Respiratory: Positive for cough. Negative for chest tightness, shortness of breath and wheezing.   Cardiovascular: Negative for chest pain and palpitations.  Psychiatric/Behavioral: Positive for sleep disturbance.    Patient Active Problem List   Diagnosis Date Noted  . Hypertension associated with diabetes (HCC) 07/02/2014  . Hyperlipidemia associated with type 2 diabetes mellitus (HCC) 07/02/2014  . Dysphagia 07/11/2012  . Pharyngitis, acute 07/11/2012  . Depression 12/07/2010  . Diabetes mellitus (HCC) 12/07/2010  . History of gastroesophageal reflux (GERD) 12/07/2010  . NAFLD (nonalcoholic fatty liver disease) 78/29/562111/07/2010  . Personal history of coronary artery disease 12/07/2010  . Menopausal osteoporosis 11/25/2010  . Primary Sjogren's syndrome (HCC) 11/25/2010  . GERD 08/21/2006  . INSOMNIA 08/21/2006    Current Outpatient Medications on File Prior to Visit  Medication Sig Dispense Refill  . aspirin 81 MG tablet Take 81 mg by mouth daily.    Marland Kitchen. atorvastatin (LIPITOR) 10 MG tablet Take 10 mg by mouth every morning.    . Calcium-Magnesium-Vitamin D 600-40-500 MG-MG-UNIT TB24 Take 1 tablet by mouth daily.    . cholecalciferol (VITAMIN D) 1000 UNITS tablet Take 1,000 Units by mouth daily.    . Cyanocobalamin (VITAMIN B 12 PO) Take by mouth.    . cyclobenzaprine (FLEXERIL) 10  MG tablet Take 10 mg by mouth every evening.    Marland Kitchen. ibuprofen (ADVIL,MOTRIN) 200 MG tablet Take 200 mg by mouth every 6 (six) hours as needed.    Marland Kitchen. losartan (COZAAR) 25 MG tablet Take 25 mg by mouth every morning.    . metFORMIN (GLUCOPHAGE) 500 MG tablet Take 500 mg by mouth 2 (two) times daily with a meal.    . Multiple Vitamin (MULTIVITAMIN) tablet Take 1 tablet by mouth daily.    . Omega-3 Fatty Acids (FISH OIL PO) Take by mouth daily.    Marland Kitchen. omeprazole (PRILOSEC) 20 MG capsule Take 20 mg by mouth every morning.    . vitamin C (ASCORBIC ACID) 500 MG tablet Take 500 mg by mouth daily.    . diphenhydrAMINE (SOMINEX) 25 MG tablet Take 25 mg by mouth at bedtime as needed. Reported on 04/01/2015    . glipiZIDE (GLUCOTROL) 10 MG tablet Take 10 mg by mouth 2 (two) times daily before a meal.    . hydroxychloroquine (PLAQUENIL) 200 MG tablet Take 200 mg by mouth 2 (two) times daily.    . vitamin B-12 (CYANOCOBALAMIN) 1000 MCG tablet Take 1,000 mcg by mouth daily.     No current facility-administered medications on file prior to visit.     Allergies  Allergen Reactions  . Iodine Shortness Of Breath  . Pentazocine Lactate Other (See Comments)    HALLUCINATION  . Shellfish Allergy Other (See Comments)    RESPIRATORY DISTRESS/ TACHYCARDIA  . Sulfonamide Derivatives Swelling    REACTION: face swells  . Citrus Rash     Objective:  BP (!) 160/94   Pulse (!) 109   Temp 98.4 F (36.9 C) (Oral)   Resp 16   Ht 5' 5.5" (1.664 m)   Wt 179 lb 12.8 oz (81.6 kg)   SpO2 96%   BMI 29.47 kg/m   Physical Exam  Constitutional: She is oriented to person, place, and time and well-developed, well-nourished, and in no distress. No distress.  HENT:  Right Ear: Tympanic membrane normal.  Left Ear: Tympanic membrane normal.  Nose: Mucosal edema present. No rhinorrhea. Right sinus exhibits no maxillary sinus tenderness and no frontal sinus tenderness. Left sinus exhibits no maxillary sinus tenderness and no  frontal sinus tenderness.  Mouth/Throat: Oropharynx is clear and moist and mucous membranes are normal.  Cardiovascular: Normal rate, regular rhythm and normal heart sounds.  Pulmonary/Chest: Effort normal and breath sounds normal. No respiratory distress. She has no wheezes. She has no rales.  Neurological: She is alert and oriented to person, place, and time. GCS score is 15.  Skin: Skin is warm and dry.  Psychiatric: Mood, memory, affect and judgment normal.  Vitals reviewed.  Dg Chest 2 View  Result Date: 12/13/2016 CLINICAL DATA:  Cough for 2 weeks EXAM: CHEST  2 VIEW COMPARISON:  02/13/2006 FINDINGS: Normal heart size and mediastinal contours. No acute infiltrate or edema. No effusion or pneumothorax. Mild dextrocurvature that may be positional. No acute osseous findings. IMPRESSION: Negative chest. Electronically Signed   By: Marnee Spring M.D.   On: 12/13/2016 15:42   Assessment and Plan :  1. Cough - DG Chest 2 View; Future - HYDROcodone-homatropine (HYCODAN) 5-1.5 MG/5ML syrup; Take 5 mLs every 8 (eight) hours as needed by mouth for cough.  Dispense: 120 mL; Refill: 0 - benzonatate (TESSALON) 100 MG capsule; Take 1-2 capsules (100-200 mg total) 3 (three) times daily as needed by mouth for cough. May take 200mg  per dose as needed.  Dispense: 40 capsule; Refill: 0  2. Acute bronchitis, unspecified organism - azithromycin (ZITHROMAX) 250 MG tablet; Take 2 tabs PO x 1 dose, then 1 tab PO QD x 4 days  Dispense: 6 tablet; Refill: 0    Marco Collie, PA-C  [facility] 02/01/2017 4:39 PM

## 2018-01-10 IMAGING — DX DG CHEST 2V
2 series · 2 of 2 positions shown · non-contrast
Comparison: 02/13/2006

CLINICAL DATA: Cough for 2 weeks

EXAM:
CHEST  2 VIEW

[chest pa]
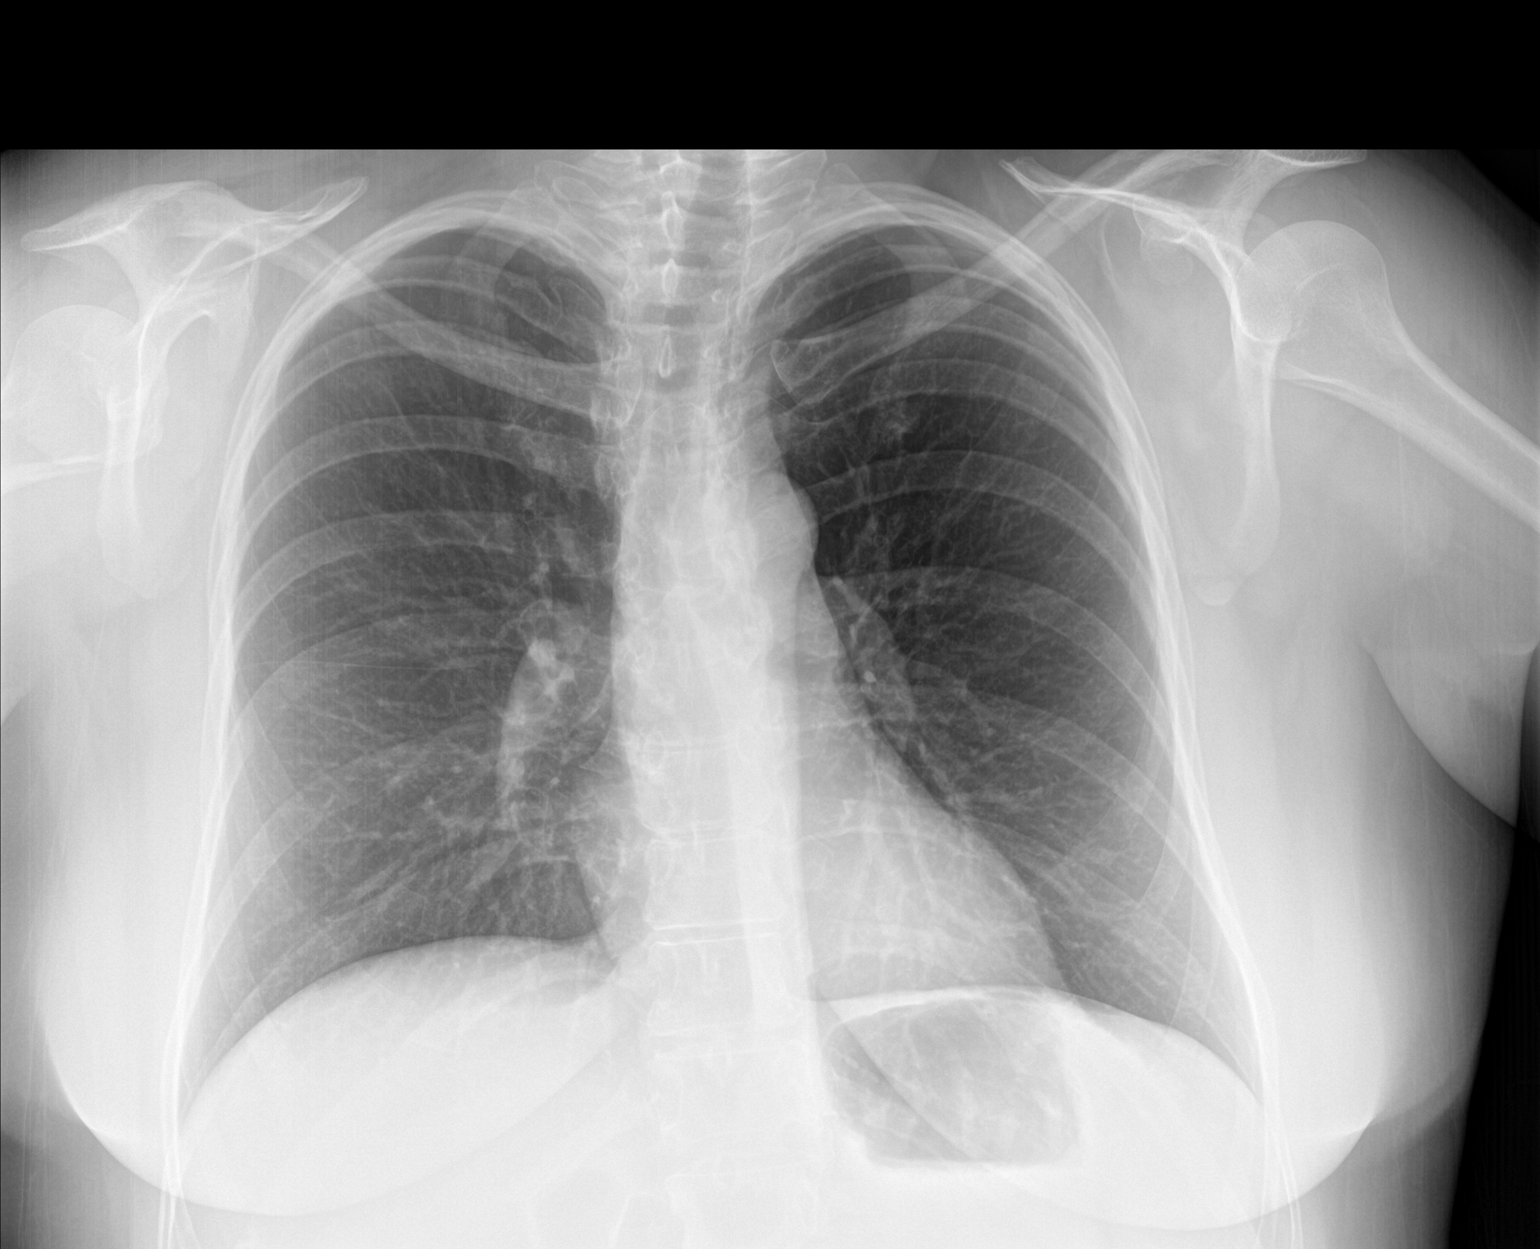

[chest lat]
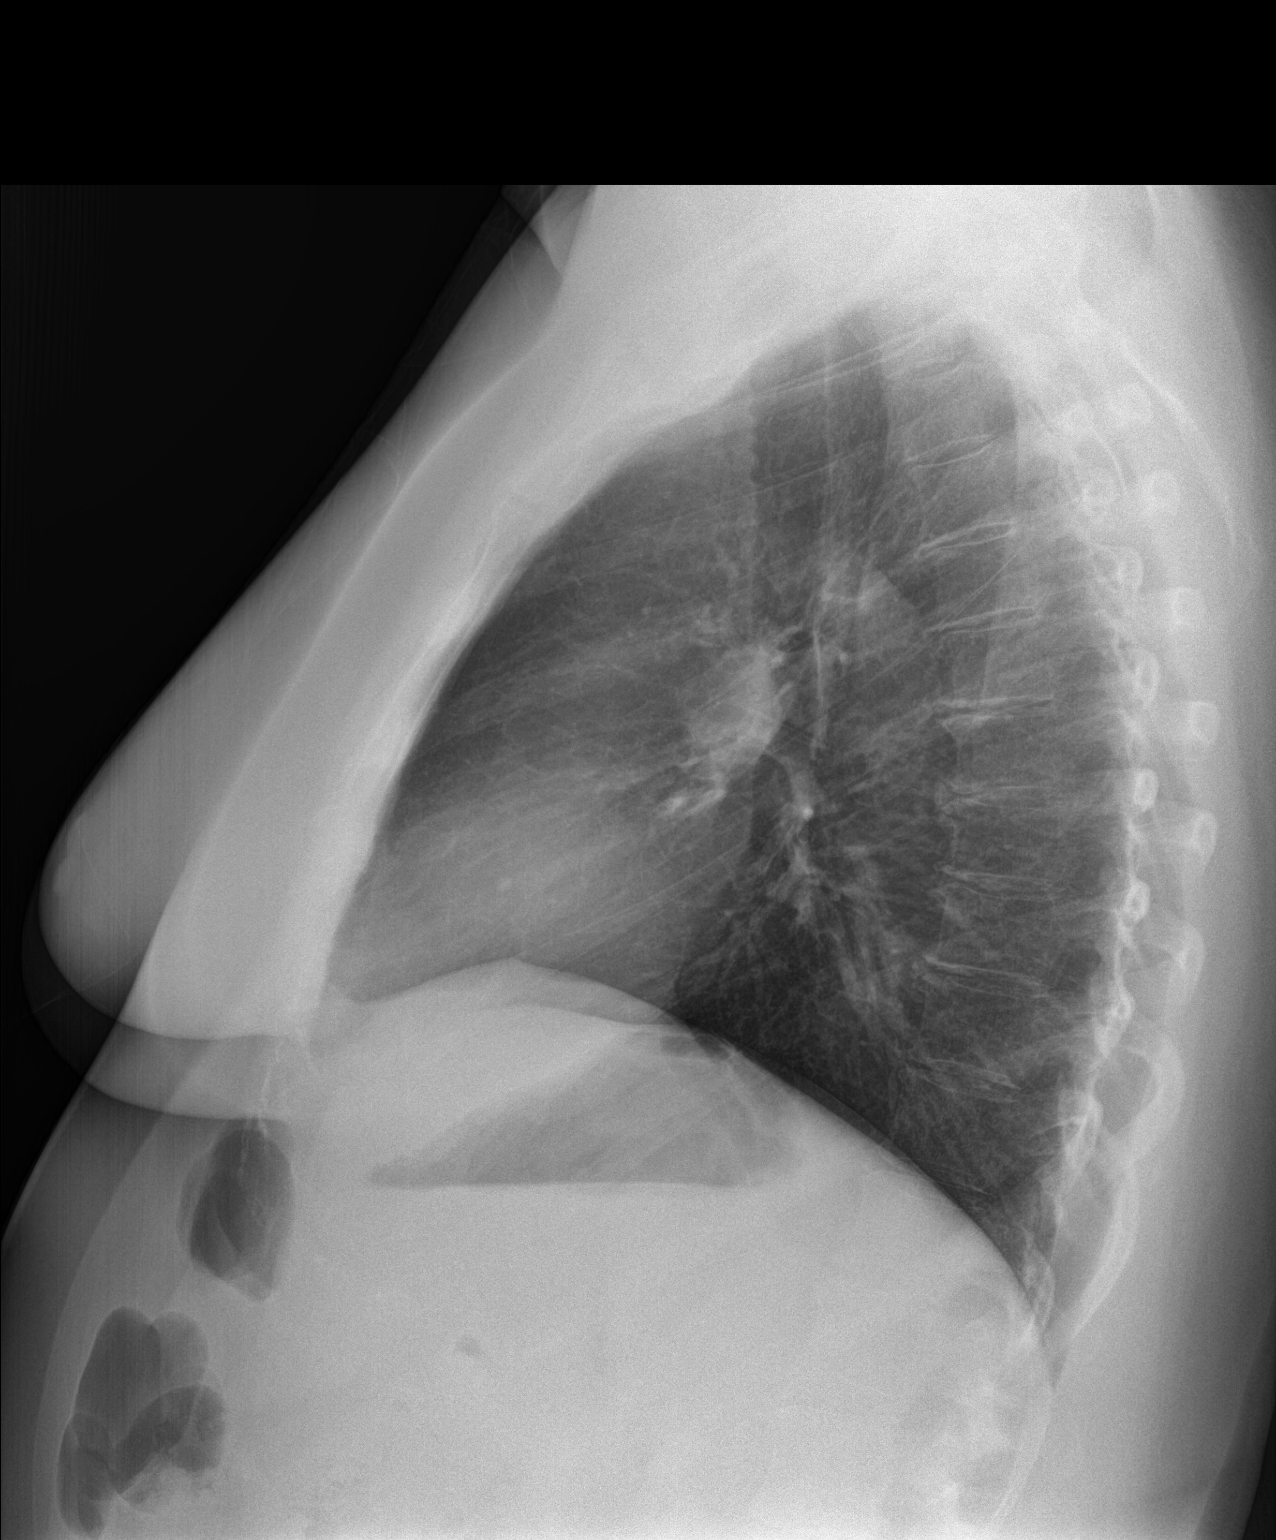

[2 of 2 positions shown; findings below may reference images not displayed]

FINDINGS: Normal heart size and mediastinal contours. No acute infiltrate or
edema. No effusion or pneumothorax. Mild dextrocurvature that may be
positional. No acute osseous findings.
IMPRESSION: Negative chest.

## 2021-08-24 NOTE — Progress Notes (Unsigned)
[name] [address] [place] [address] Phone: [phone] Subjective:   Bruce Donath, am serving as a scribe for Dr. Antoine Primas.  I'm seeing this patient by the request  of:  System, Provider Not In  CC: Left wrist pain Bruising of the chest wall  NTI:[id number]  Sarah Sanford is a 61 y.o. female coming in with complaint of L wrist fracture and bruised rib from a pickleball injury. Patient states that on Friday morning she tripped over her partner and landed on her side and head. Ribs are most painful. Thumb is doing ok now that it is splinted. Also having tenderness over superior orbit. Patient is having constant headache since injury.        Past Medical History:  Diagnosis Date   Antiphospholipid syndrome (HCC)    Anxiety    Chronic back pain HX  FX TAILBONE   Complication of anesthesia HARD TO WAKE   Diabetes mellitus    Fibromyalgia    GERD (gastroesophageal reflux disease)    History of Bell's palsy BILATERAL X4 IN 2006-- RESOLVED   History of lupus (HCC) HX POSITIVE BLOODWORK-- IN REMISSION   Hyperlipidemia    Hypertension    IBS (irritable bowel syndrome)    Migraines    Normal cardiac stress test 6 YRS AGO   RA (rheumatoid arthritis) (HCC)    Sjoegren syndrome (HCC)    Past Surgical History:  Procedure Laterality Date   CERVICAL CERCLAGE  X2   CERVICAL CONIZATION W/BX  1989   EXAMINATION UNDER ANESTHESIA  03/30/2011   Procedure: EXAM UNDER ANESTHESIA;  Surgeon: Kathi Ludwig, MD;  Location: [facility];  Service: Urology;  Laterality: N/A;  lysis of vaginal scar   RIGHT PERIURETHRAL EXPLORATION EXCISION MENTOR TRANSOBTURATOR TAPE/ PLACEMENT XENFORM  11-03-2010   VAGINAL EXPL. EXCISION RIGHT PERIURTHRAL MENTOR TAPE  07-26-2007   VAGINAL HYSTERECTOMY / BSO/ IMPLANT MENTOR PUBOVAGINAL SLING  05-11-2003   Social History   Socioeconomic History   Marital status: Married    Spouse name: Not  on file   Number of children: Not on file   Years of education: Not on file   Highest education level: Not on file  Occupational History   Not on file  Tobacco Use   Smoking status: Never   Smokeless tobacco: Never  Substance and Sexual Activity   Alcohol use: Yes    Comment: RARE   Drug use: No   Sexual activity: Not on file  Other Topics Concern   Not on file  Social History Narrative   Not on file   Social Determinants of Health   Financial Resource Strain: Not on file  Food Insecurity: Not on file  Transportation Needs: Not on file  Physical Activity: Not on file  Stress: Not on file  Social Connections: Not on file   Allergies  Allergen Reactions   Iodine Shortness Of Breath   Pentazocine Lactate Other (See Comments)    HALLUCINATION   Shellfish Allergy Other (See Comments)    RESPIRATORY DISTRESS/ TACHYCARDIA   Sulfonamide Derivatives Swelling    REACTION: face swells   Citrus Rash   Family History  Problem Relation Age of Onset   Colon polyps Mother    Heart disease Mother     Current Outpatient Medications (Endocrine & Metabolic):    glipiZIDE (GLUCOTROL) 10 MG tablet, Take 10 mg by mouth 2 (two) times daily before a meal.   metFORMIN (GLUCOPHAGE) 500  MG tablet, Take 500 mg by mouth 2 (two) times daily with a meal.  Current Outpatient Medications (Cardiovascular):    atorvastatin (LIPITOR) 10 MG tablet, Take 10 mg by mouth every morning.   losartan (COZAAR) 25 MG tablet, Take 25 mg by mouth every morning.  Current Outpatient Medications (Respiratory):    benzonatate (TESSALON) 100 MG capsule, Take 1-2 capsules (100-200 mg total) 3 (three) times daily as needed by mouth for cough. May take 200mg  per dose as needed.   HYDROcodone-homatropine (HYCODAN) 5-1.5 MG/5ML syrup, Take 5 mLs every 8 (eight) hours as needed by mouth for cough.  Current Outpatient Medications (Analgesics):    aspirin 81 MG tablet, Take 81 mg by mouth daily.   ibuprofen  (ADVIL,MOTRIN) 200 MG tablet, Take 200 mg by mouth every 6 (six) hours as needed.  Current Outpatient Medications (Hematological):    vitamin B-12 (CYANOCOBALAMIN) 1000 MCG tablet, Take 1,000 mcg by mouth daily.   Cyanocobalamin (VITAMIN B 12 PO), Take by mouth.  Current Outpatient Medications (Other):    Calcium-Magnesium-Vitamin D 600-40-500 MG-MG-UNIT TB24, Take 1 tablet by mouth daily.   cholecalciferol (VITAMIN D) 1000 UNITS tablet, Take 1,000 Units by mouth daily.   cyclobenzaprine (FLEXERIL) 10 MG tablet, Take 10 mg by mouth every evening.   Multiple Vitamin (MULTIVITAMIN) tablet, Take 1 tablet by mouth daily.   Omega-3 Fatty Acids (FISH OIL PO), Take by mouth daily.   omeprazole (PRILOSEC) 20 MG capsule, Take 20 mg by mouth every morning.   vitamin C (ASCORBIC ACID) 500 MG tablet, Take 500 mg by mouth daily.   azithromycin (ZITHROMAX) 250 MG tablet, Take 2 tabs PO x 1 dose, then 1 tab PO QD x 4 days   diphenhydrAMINE (SOMINEX) 25 MG tablet, Take 25 mg by mouth at bedtime as needed. Reported on 04/01/2015   hydroxychloroquine (PLAQUENIL) 200 MG tablet, Take 200 mg by mouth 2 (two) times daily.   Reviewed prior external information including notes and imaging from  primary care provider As well as notes that were available from care everywhere and other healthcare systems.  Past medical history, social, surgical and family history all reviewed in electronic medical record.  No pertanent information unless stated regarding to the chief complaint.   Review of Systems:  No headache, visual changes, nausea, vomiting, diarrhea, constipation, dizziness, abdominal pain, skin rash, fevers, chills, night sweats, weight loss, swollen lymph nodes, body aches, joint swelling, chest pain, shortness of breath, mood changes. POSITIVE muscle aches  Objective  Blood pressure 120/72, pulse 94, height 5' 5.5" (1.664 m), weight 170 lb 9.6 oz (77.4 kg), SpO2 99 %.   General: No apparent distress alert  and oriented x3 mood and affect normal, dressed appropriately.  HEENT: Pupils equal, extraocular movements intact  Respiratory: Patient's speak in full sentences and does not appear short of breath  Cardiovascular: No lower extremity edema, non tender, no erythema  Patient is able to take a deep breath without any significant amount of discomfort. Patient's left chest wall does have a bruise noted around the T8 rib anterior just lateral to the nipple line.  Patient is tender in the area.  No crepitus noted.  Left wrist exam does have bruising around the to the distal radius.  Tender to palpation in the anatomical snuffbox  Limited muscular skeletal ultrasound was performed and interpreted by 06/01/2015, M  Limited ultrasound shows a cortical irregularity of the distal radius.  Nondisplaced.  Hypoechoic changes and increasing neovascularization.  Mild increase in neovascularization  and hypoechoic changes of the scaphoid bone but no cortical irregularity noted. Impression: Distal radius fracture nondisplaced with questionable injury to the scaphoid    Impression and Recommendations:

## 2021-08-25 ENCOUNTER — Ambulatory Visit (INDEPENDENT_AMBULATORY_CARE_PROVIDER_SITE_OTHER): Payer: BC Managed Care – PPO | Admitting: Family Medicine

## 2021-08-25 ENCOUNTER — Ambulatory Visit: Payer: Self-pay

## 2021-08-25 ENCOUNTER — Encounter: Payer: Self-pay | Admitting: Family Medicine

## 2021-08-25 VITALS — BP 120/72 | HR 94 | Ht 65.5 in | Wt 170.6 lb

## 2021-08-25 DIAGNOSIS — S52552A Other extraarticular fracture of lower end of left radius, initial encounter for closed fracture: Secondary | ICD-10-CM | POA: Diagnosis not present

## 2021-08-25 DIAGNOSIS — S2232XA Fracture of one rib, left side, initial encounter for closed fracture: Secondary | ICD-10-CM | POA: Diagnosis not present

## 2021-08-25 DIAGNOSIS — M25532 Pain in left wrist: Secondary | ICD-10-CM

## 2021-08-25 DIAGNOSIS — R0781 Pleurodynia: Secondary | ICD-10-CM | POA: Diagnosis not present

## 2021-08-25 DIAGNOSIS — S52502A Unspecified fracture of the lower end of left radius, initial encounter for closed fracture: Secondary | ICD-10-CM | POA: Insufficient documentation

## 2021-08-25 NOTE — Assessment & Plan Note (Signed)
I believe the patient does have a likely left rib fracture that seems to be more at the T8 level.  Seems to be more anteriorly.  Take deep breaths to avoid any type of pain such as pneumonia.  We discussed potential pain medications with patient declined.  Muscle relaxers patient does have follow-up with me again in 2 weeks

## 2021-08-25 NOTE — Patient Instructions (Addendum)
Thumb spica splint-Wear it 24/7 until we see you CoQ10 200mg  for headaches Arnica lotion for the bruising I would consider Tylenol 325 3x a day Can take IBU if needed but not regularity 10 deep breaths every hour See me in 10-14 days Xray first

## 2021-08-25 NOTE — Assessment & Plan Note (Signed)
Extra-articular fracture noted.  Seems to have may be even a mild avulsion of the styloid.  Patient put in a removable brace with a thumb spica.  Discussed with patient icing regimen patient will follow-up again in 2 to 3 weeks for removal of the ultrasound and likely rex-ray.  Patient scaphoid bone in the area as well.

## 2021-09-08 NOTE — Progress Notes (Signed)
[facility] [address] [place] [address] Phone: [phone] Subjective:   Sarah Sanford, am serving as a scribe for Dr. Antoine Primas.  I'm seeing this patient by the request  of:  System, Provider Not In  CC: rib and wrist pain follow up   DTO:[id number]  08/25/2021 I believe the patient does have a likely left rib fracture that seems to be more at the T8 level.  Seems to be more anteriorly.  Take deep breaths to avoid any type of pain such as pneumonia.  We discussed potential pain medications with patient declined.  Muscle relaxers patient does have follow-up with me again in 2 weeks  Extra-articular fracture noted.  Seems to have may be even a mild avulsion of the styloid.  Patient put in a removable brace with a thumb spica.  Discussed with patient icing regimen patient will follow-up again in 2 to 3 weeks for removal of the ultrasound and likely rex-ray.  Patient scaphoid bone in the area as well.  Updated 09/09/2021 Sarah Sanford is a 61 y.o. female coming in with complaint of rib and wrist f/x. Rib pain has improved. Less pain with inspiration.   Wrist pain has not improved. When she uses her fingers too much she will have pain in the wrist.   Had brown urine last week and went to urgent care and was told that she injured kidney in the fall. On antibiotic.   Pain in L eye and headaches still noted but maybe better        Past Medical History:  Diagnosis Date   Antiphospholipid syndrome (HCC)    Anxiety    Chronic back pain HX  FX TAILBONE   Complication of anesthesia HARD TO WAKE   Diabetes mellitus    Fibromyalgia    GERD (gastroesophageal reflux disease)    History of Bell's palsy BILATERAL X4 IN 2006-- RESOLVED   History of lupus HX POSITIVE BLOODWORK-- IN REMISSION   Hyperlipidemia    Hypertension    IBS (irritable bowel syndrome)    Migraines    Normal cardiac stress test 6 YRS AGO   RA (rheumatoid arthritis) (HCC)     Sjoegren syndrome    Past Surgical History:  Procedure Laterality Date   CERVICAL CERCLAGE  X2   CERVICAL CONIZATION W/BX  1989   EXAMINATION UNDER ANESTHESIA  03/30/2011   Procedure: EXAM UNDER ANESTHESIA;  Surgeon: Kathi Ludwig, MD;  Location: [facility];  Service: Urology;  Laterality: N/A;  lysis of vaginal scar   RIGHT PERIURETHRAL EXPLORATION EXCISION MENTOR TRANSOBTURATOR TAPE/ PLACEMENT XENFORM  11-03-2010   VAGINAL EXPL. EXCISION RIGHT PERIURTHRAL MENTOR TAPE  07-26-2007   VAGINAL HYSTERECTOMY / BSO/ IMPLANT MENTOR PUBOVAGINAL SLING  05-11-2003   Social History   Socioeconomic History   Marital status: Married    Spouse name: Not on file   Number of children: Not on file   Years of education: Not on file   Highest education level: Not on file  Occupational History   Not on file  Tobacco Use   Smoking status: Never   Smokeless tobacco: Never  Substance and Sexual Activity   Alcohol use: Yes    Comment: RARE   Drug use: No   Sexual activity: Not on file  Other Topics Concern   Not on file  Social History Narrative   Not on file   Social Determinants of Health   Financial Resource Strain: Not  on file  Food Insecurity: Not on file  Transportation Needs: Not on file  Physical Activity: Not on file  Stress: Not on file  Social Connections: Not on file   Allergies  Allergen Reactions   Iodine Shortness Of Breath   Pentazocine Lactate Other (See Comments)    HALLUCINATION   Shellfish Allergy Other (See Comments)    RESPIRATORY DISTRESS/ TACHYCARDIA   Sulfonamide Derivatives Swelling    REACTION: face swells   Citrus Rash   Family History  Problem Relation Age of Onset   Colon polyps Mother    Heart disease Mother     Current Outpatient Medications (Endocrine & Metabolic):    glipiZIDE (GLUCOTROL) 10 MG tablet, Take 10 mg by mouth 2 (two) times daily before a meal.   metFORMIN (GLUCOPHAGE) 500 MG tablet, Take 500 mg by mouth 2  (two) times daily with a meal.  Current Outpatient Medications (Cardiovascular):    atorvastatin (LIPITOR) 10 MG tablet, Take 10 mg by mouth every morning.   losartan (COZAAR) 25 MG tablet, Take 25 mg by mouth every morning.  Current Outpatient Medications (Respiratory):    benzonatate (TESSALON) 100 MG capsule, Take 1-2 capsules (100-200 mg total) 3 (three) times daily as needed by mouth for cough. May take 200mg  per dose as needed.   HYDROcodone-homatropine (HYCODAN) 5-1.5 MG/5ML syrup, Take 5 mLs every 8 (eight) hours as needed by mouth for cough.  Current Outpatient Medications (Analgesics):    aspirin 81 MG tablet, Take 81 mg by mouth daily.   ibuprofen (ADVIL,MOTRIN) 200 MG tablet, Take 200 mg by mouth every 6 (six) hours as needed.  Current Outpatient Medications (Hematological):    Cyanocobalamin (VITAMIN B 12 PO), Take by mouth.   vitamin B-12 (CYANOCOBALAMIN) 1000 MCG tablet, Take 1,000 mcg by mouth daily.  Current Outpatient Medications (Other):    azithromycin (ZITHROMAX) 250 MG tablet, Take 2 tabs PO x 1 dose, then 1 tab PO QD x 4 days   Calcium-Magnesium-Vitamin D 600-40-500 MG-MG-UNIT TB24, Take 1 tablet by mouth daily.   cholecalciferol (VITAMIN D) 1000 UNITS tablet, Take 1,000 Units by mouth daily.   cyclobenzaprine (FLEXERIL) 10 MG tablet, Take 10 mg by mouth every evening.   diphenhydrAMINE (SOMINEX) 25 MG tablet, Take 25 mg by mouth at bedtime as needed. Reported on 04/01/2015   hydroxychloroquine (PLAQUENIL) 200 MG tablet, Take 200 mg by mouth 2 (two) times daily.   Multiple Vitamin (MULTIVITAMIN) tablet, Take 1 tablet by mouth daily.   Omega-3 Fatty Acids (FISH OIL PO), Take by mouth daily.   omeprazole (PRILOSEC) 20 MG capsule, Take 20 mg by mouth every morning.   vitamin C (ASCORBIC ACID) 500 MG tablet, Take 500 mg by mouth daily.   Reviewed prior external information including notes and imaging from  primary care provider As well as notes that were available  from care everywhere and other healthcare systems.  Past medical history, social, surgical and family history all reviewed in electronic medical record.  No pertanent information unless stated regarding to the chief complaint.   Review of Systems:  No significant, visual changes, nausea, vomiting, diarrhea, constipation, dizziness, abdominal pain, skin rash, fevers, chills, night sweats, weight loss, swollen lymph nodes, body aches, joint swelling, chest pain, shortness of breath, mood changes. POSITIVE muscle aches, headache  Objective  Blood pressure 104/62, pulse (!) 109, height 5' 5.5" (1.664 m), SpO2 90 %.   General: No apparent distress alert and oriented x3 mood and affect normal, dressed appropriately.  HEENT:  Pupils equal, extraocular movements intact still has bruising over the left but does seem to be improving. Respiratory: Patient's speak in full sentences and does not appear short of breath  Cardiovascular: No lower extremity edema, non tender, no erythema  Patient is less tender over the left side of the ribs today.  Patient's left wrist severe tenderness over the TFCC.  Patient has involuntary guarding also noted.  Mild stiffness with her being in the brace and swelling is improved.  Limited muscular skeletal ultrasound was performed and interpreted by Antoine Primas, M  Limited ultrasound still shows a cortical irregularity noted of the distal radius.  Patient still has an abnormality noted of the scaphoid.  Significant hypoechoic changes noted within the TFCC with increasing neovascularization in Doppler flow. Impression: Distal avulsion of the distal radius and irregularity of the TFCC    Impression and Recommendations:    The above documentation has been reviewed and is accurate and complete Judi Saa, DO

## 2021-09-09 ENCOUNTER — Encounter: Payer: Self-pay | Admitting: Family Medicine

## 2021-09-09 ENCOUNTER — Ambulatory Visit (INDEPENDENT_AMBULATORY_CARE_PROVIDER_SITE_OTHER): Payer: BC Managed Care – PPO | Admitting: Family Medicine

## 2021-09-09 ENCOUNTER — Ambulatory Visit: Payer: Self-pay

## 2021-09-09 ENCOUNTER — Ambulatory Visit (INDEPENDENT_AMBULATORY_CARE_PROVIDER_SITE_OTHER): Payer: BC Managed Care – PPO

## 2021-09-09 VITALS — BP 104/62 | HR 109 | Ht 65.5 in

## 2021-09-09 DIAGNOSIS — M2550 Pain in unspecified joint: Secondary | ICD-10-CM

## 2021-09-09 DIAGNOSIS — S52552A Other extraarticular fracture of lower end of left radius, initial encounter for closed fracture: Secondary | ICD-10-CM | POA: Diagnosis not present

## 2021-09-09 DIAGNOSIS — M25532 Pain in left wrist: Secondary | ICD-10-CM

## 2021-09-09 DIAGNOSIS — R0781 Pleurodynia: Secondary | ICD-10-CM | POA: Diagnosis not present

## 2021-09-09 LAB — CBC WITH DIFFERENTIAL/PLATELET
Basophils Absolute: 0.1 10*3/uL (ref 0.0–0.1)
Basophils Relative: 0.7 % (ref 0.0–3.0)
Eosinophils Absolute: 0.3 10*3/uL (ref 0.0–0.7)
Eosinophils Relative: 2.9 % (ref 0.0–5.0)
HCT: 38.8 % (ref 36.0–46.0)
Hemoglobin: 13 g/dL (ref 12.0–15.0)
Lymphocytes Relative: 31 % (ref 12.0–46.0)
Lymphs Abs: 2.7 10*3/uL (ref 0.7–4.0)
MCHC: 33.6 g/dL (ref 30.0–36.0)
MCV: 85 fl (ref 78.0–100.0)
Monocytes Absolute: 0.5 10*3/uL (ref 0.1–1.0)
Monocytes Relative: 6.1 % (ref 3.0–12.0)
Neutro Abs: 5.2 10*3/uL (ref 1.4–7.7)
Neutrophils Relative %: 59.3 % (ref 43.0–77.0)
Platelets: 284 10*3/uL (ref 150.0–400.0)
RBC: 4.56 Mil/uL (ref 3.87–5.11)
RDW: 13.9 % (ref 11.5–15.5)
WBC: 8.8 10*3/uL (ref 4.0–10.5)

## 2021-09-09 LAB — COMPREHENSIVE METABOLIC PANEL
ALT: 46 U/L — ABNORMAL HIGH (ref 0–35)
AST: 34 U/L (ref 0–37)
Albumin: 4.9 g/dL (ref 3.5–5.2)
Alkaline Phosphatase: 78 U/L (ref 39–117)
BUN: 21 mg/dL (ref 6–23)
CO2: 25 mEq/L (ref 19–32)
Calcium: 10.2 mg/dL (ref 8.4–10.5)
Chloride: 101 mEq/L (ref 96–112)
Creatinine, Ser: 0.71 mg/dL (ref 0.40–1.20)
GFR: 91.09 mL/min (ref >60.00)
Glucose, Bld: 136 mg/dL — ABNORMAL HIGH (ref 70–99)
Potassium: 4 mEq/L (ref 3.5–5.1)
Sodium: 137 mEq/L (ref 135–145)
Total Bilirubin: 0.4 mg/dL (ref 0.2–1.2)
Total Protein: 8.1 g/dL (ref 6.0–8.3)

## 2021-09-09 NOTE — Assessment & Plan Note (Signed)
Patient does have a fracture and small avulsion noted.  Still is concerned about the scaphoid and the severe pain over the TFCC.  Due to the swelling now I do not think any arthrogram would be beneficial or patient will be able to tolerate.  Do feel an MRI of the wrist will allow [place] to see sufficiently if any other management is necessary.  Patient will continue with the removable brace because she does feel better.  At the moment and we will follow-up after imaging.

## 2021-09-09 NOTE — Patient Instructions (Addendum)
Lab work today Mri L wrist  [phone] If headaches worsen with nausea and vomiting, seek medical attention See me again in 4 weeks but we will speak before then

## 2021-09-10 ENCOUNTER — Ambulatory Visit (INDEPENDENT_AMBULATORY_CARE_PROVIDER_SITE_OTHER): Payer: BC Managed Care – PPO

## 2021-09-10 DIAGNOSIS — M25532 Pain in left wrist: Secondary | ICD-10-CM | POA: Diagnosis not present

## 2021-09-13 ENCOUNTER — Other Ambulatory Visit: Payer: Self-pay

## 2021-09-13 ENCOUNTER — Encounter: Payer: Self-pay | Admitting: Family Medicine

## 2021-09-13 DIAGNOSIS — S63592D Other specified sprain of left wrist, subsequent encounter: Secondary | ICD-10-CM

## 2021-10-04 NOTE — Progress Notes (Signed)
[facility] [address] [place] Phone: [phone] Subjective:   IVilma Meckel, am serving as a scribe for Dr. Hulan Saas.  I'm seeing this patient by the request  of:  System, Provider Not In  CC: Left wrist follow-up  [id number]  09/09/2021 Patient does have a fracture and small avulsion noted.  Still is concerned about the scaphoid and the severe pain over the TFCC.  Due to the swelling now I do not think any arthrogram would be beneficial or patient will be able to tolerate.  Do feel an MRI of the wrist will allow [place] to see sufficiently if any other management is necessary.  Patient will continue with the removable brace because she does feel better.  At the moment and we will follow-up after imaging.  Update 10/11/2021 JHENNA ENQUIST is a 61 y.o. female coming in with complaint of L wrist pain. Patient referred to Dr. Amedeo Plenty. Patient states doing better since last visit. Has an appointment with [facility] on the 14th. Still tender with motion. No new complaints.  MRI IMPRESSION: 1. Acute minimally displaced cortical avulsion fracture at the tip of the radial styloid. 2. Nondisplaced fracture of the fourth metacarpal base. 3. Partial tear of the TFCC from its ulnar attachments. Reactive marrow edema in the distal ulna without discrete fracture. 4. 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 5. Mild osteoarthritic changes of the wrist are most pronounced at the first [facility] joint and triscaphe joint.      Past Medical History:  Diagnosis Date   Antiphospholipid syndrome (HCC)    Anxiety    Chronic back pain HX  FX TAILBONE   Complication of anesthesia HARD TO WAKE   Diabetes mellitus    Fibromyalgia    GERD (gastroesophageal reflux disease)    History of Bell's palsy BILATERAL X4 IN 2006-- RESOLVED   History of lupus HX POSITIVE BLOODWORK-- IN REMISSION   Hyperlipidemia     Hypertension    IBS (irritable bowel syndrome)    Migraines    Normal cardiac stress test 6 YRS AGO   RA (rheumatoid arthritis) (Christmas)    Sjoegren syndrome    Past Surgical History:  Procedure Laterality Date   CERVICAL CERCLAGE  X2   CERVICAL CONIZATION W/BX  1989   EXAMINATION UNDER ANESTHESIA  03/30/2011   Procedure: EXAM UNDER ANESTHESIA;  Surgeon: Ailene Rud, MD;  Location: [facility];  Service: Urology;  Laterality: N/A;  lysis of vaginal scar   RIGHT PERIURETHRAL EXPLORATION EXCISION MENTOR TRANSOBTURATOR TAPE/ PLACEMENT XENFORM  11-03-2010   VAGINAL EXPL. EXCISION RIGHT PERIURTHRAL MENTOR TAPE  07-26-2007   VAGINAL HYSTERECTOMY / BSO/ IMPLANT MENTOR PUBOVAGINAL SLING  05-11-2003   Social History   Socioeconomic History   Marital status: Married    Spouse name: Not on file   Number of children: Not on file   Years of education: Not on file   Highest education level: Not on file  Occupational History   Not on file  Tobacco Use   Smoking status: Never   Smokeless tobacco: Never  Substance and Sexual Activity   Alcohol use: Yes    Comment: RARE   Drug use: No   Sexual activity: Not on file  Other Topics Concern   Not on file  Social History Narrative   Not on file   Social Determinants of Health   Financial Resource Strain: Not on file  Food Insecurity: Not  on file  Transportation Needs: Not on file  Physical Activity: Not on file  Stress: Not on file  Social Connections: Not on file   Allergies  Allergen Reactions   Iodine Shortness Of Breath   Pentazocine Lactate Other (See Comments)    HALLUCINATION   Shellfish Allergy Other (See Comments)    RESPIRATORY DISTRESS/ TACHYCARDIA   Sulfonamide Derivatives Swelling    REACTION: face swells   Citrus Rash   Family History  Problem Relation Age of Onset   Colon polyps Mother    Heart disease Mother     Current Outpatient Medications (Endocrine & Metabolic):    glipiZIDE  (GLUCOTROL) 10 MG tablet, Take 10 mg by mouth 2 (two) times daily before a meal.   metFORMIN (GLUCOPHAGE) 500 MG tablet, Take 500 mg by mouth 2 (two) times daily with a meal.  Current Outpatient Medications (Cardiovascular):    atorvastatin (LIPITOR) 10 MG tablet, Take 10 mg by mouth every morning.   losartan (COZAAR) 25 MG tablet, Take 25 mg by mouth every morning.  Current Outpatient Medications (Respiratory):    benzonatate (TESSALON) 100 MG capsule, Take 1-2 capsules (100-200 mg total) 3 (three) times daily as needed by mouth for cough. May take 200mg  per dose as needed.   HYDROcodone-homatropine (HYCODAN) 5-1.5 MG/5ML syrup, Take 5 mLs every 8 (eight) hours as needed by mouth for cough.  Current Outpatient Medications (Analgesics):    aspirin 81 MG tablet, Take 81 mg by mouth daily.   ibuprofen (ADVIL,MOTRIN) 200 MG tablet, Take 200 mg by mouth every 6 (six) hours as needed.  Current Outpatient Medications (Hematological):    Cyanocobalamin (VITAMIN B 12 PO), Take by mouth.   vitamin B-12 (CYANOCOBALAMIN) 1000 MCG tablet, Take 1,000 mcg by mouth daily.  Current Outpatient Medications (Other):    azithromycin (ZITHROMAX) 250 MG tablet, Take 2 tabs PO x 1 dose, then 1 tab PO QD x 4 days   Calcium-Magnesium-Vitamin D 600-40-500 MG-MG-UNIT TB24, Take 1 tablet by mouth daily.   cholecalciferol (VITAMIN D) 1000 UNITS tablet, Take 1,000 Units by mouth daily.   cyclobenzaprine (FLEXERIL) 10 MG tablet, Take 10 mg by mouth every evening.   diphenhydrAMINE (SOMINEX) 25 MG tablet, Take 25 mg by mouth at bedtime as needed. Reported on 04/01/2015   hydroxychloroquine (PLAQUENIL) 200 MG tablet, Take 200 mg by mouth 2 (two) times daily.   Multiple Vitamin (MULTIVITAMIN) tablet, Take 1 tablet by mouth daily.   Omega-3 Fatty Acids (FISH OIL PO), Take by mouth daily.   omeprazole (PRILOSEC) 20 MG capsule, Take 20 mg by mouth every morning.   vitamin C (ASCORBIC ACID) 500 MG tablet, Take 500 mg by mouth  daily.   Reviewed prior external information including notes and imaging from  primary care provider As well as notes that were available from care everywhere and other healthcare systems.  Past medical history, social, surgical and family history all reviewed in electronic medical record.  No pertanent information unless stated regarding to the chief complaint.   Review of Systems:  No headache, visual changes, nausea, vomiting, diarrhea, constipation, dizziness, abdominal pain, skin rash, fevers, chills, night sweats, weight loss, swollen lymph nodes, body aches, joint swelling, chest pain, shortness of breath, mood changes. POSITIVE muscle aches  Objective  Blood pressure 108/74, pulse 66, height 5\' 5"  (1.651 m), weight 176 lb (79.8 kg), SpO2 98 %.   General: No apparent distress alert and oriented x3 mood and affect normal, dressed appropriately.  HEENT: Pupils equal, extraocular  movements intact  Respiratory: Patient's speak in full sentences and does not appear short of breath  Cardiovascular: No lower extremity edema, non tender, no erythema  Left wrist still has some mild swelling noted.  Seems to be more rigid TFCC than anywhere else.  Patient is minorly tender over the anatomical snuffbox.  Good grip strength noted   Limited muscular skeletal ultrasound was performed and interpreted by Antoine Primas, M  Limited ultrasound of patient's left wrist shows hypoechoic changes and some increase in neovascularization of the TFCC noted.  Less displacement than previous exam.  Patient does have healing noted of the distal radius.  Seems to be not as displaced as previously. Impression: Interval improvement.   Impression and Recommendations:    The above documentation has been reviewed and is accurate and complete Judi Saa, DO

## 2021-10-11 ENCOUNTER — Encounter: Payer: Self-pay | Admitting: Family Medicine

## 2021-10-11 ENCOUNTER — Ambulatory Visit (INDEPENDENT_AMBULATORY_CARE_PROVIDER_SITE_OTHER): Payer: BC Managed Care – PPO | Admitting: Family Medicine

## 2021-10-11 ENCOUNTER — Ambulatory Visit: Payer: Self-pay

## 2021-10-11 VITALS — BP 108/74 | HR 66 | Ht 65.0 in | Wt 176.0 lb

## 2021-10-11 DIAGNOSIS — S63592D Other specified sprain of left wrist, subsequent encounter: Secondary | ICD-10-CM

## 2021-10-11 DIAGNOSIS — S52552A Other extraarticular fracture of lower end of left radius, initial encounter for closed fracture: Secondary | ICD-10-CM

## 2021-10-11 NOTE — Assessment & Plan Note (Signed)
I believe the patient is healing well at this moment.  I believe the patient is going to do well with 3 different fractures the patient had.  The concern is the TFCC.  I do think at this point though I would start with occupational therapy.  Patient is going to see orthopedic surgery to get get a second opinion.  Depending on that we can start occupational therapy afterwards.  We have discussed that in 2 months otherwise.  Could consider PRP if we seem to stop with any progression and treatment.

## 2021-10-11 NOTE — Patient Instructions (Signed)
Good to see you! I'm okay with you getting out of brace Would recommend Occupational Therapy Read about PRP if still having trouble in 2 months

## 2021-12-08 NOTE — Progress Notes (Deleted)
[facility] [address] [place] [address] Phone: [phone] Subjective:    I'm seeing this patient by the request  of:  System, Provider Not In  CC:   [id number]  10/11/2021 I believe the patient is healing well at this moment.  I believe the patient is going to do well with 3 different fractures the patient had.  The concern is the TFCC.  I do think at this point though I would start with occupational therapy.  Patient is going to see orthopedic surgery to get get a second opinion.  Depending on that we can start occupational therapy afterwards.  We have discussed that in 2 months otherwise.  Could consider PRP if we seem to stop with any progression and treatment.    Update 12/13/2021 Sarah Sanford is a 61 y.o. female coming in with complaint of L wrist pain. Patient states        Past Medical History:  Diagnosis Date   Antiphospholipid syndrome (HCC)    Anxiety    Chronic back pain HX  FX TAILBONE   Complication of anesthesia HARD TO WAKE   Diabetes mellitus    Fibromyalgia    GERD (gastroesophageal reflux disease)    History of Bell's palsy BILATERAL X4 IN 2006-- RESOLVED   History of lupus HX POSITIVE BLOODWORK-- IN REMISSION   Hyperlipidemia    Hypertension    IBS (irritable bowel syndrome)    Migraines    Normal cardiac stress test 6 YRS AGO   RA (rheumatoid arthritis) (HCC)    Sjoegren syndrome    Past Surgical History:  Procedure Laterality Date   CERVICAL CERCLAGE  X2   CERVICAL CONIZATION W/BX  1989   EXAMINATION UNDER ANESTHESIA  03/30/2011   Procedure: EXAM UNDER ANESTHESIA;  Surgeon: Kathi Ludwig, MD;  Location: [facility];  Service: Urology;  Laterality: N/A;  lysis of vaginal scar   RIGHT PERIURETHRAL EXPLORATION EXCISION MENTOR TRANSOBTURATOR TAPE/ PLACEMENT XENFORM  11-03-2010   VAGINAL EXPL. EXCISION RIGHT PERIURTHRAL MENTOR TAPE  07-26-2007   VAGINAL HYSTERECTOMY / BSO/ IMPLANT  MENTOR PUBOVAGINAL SLING  05-11-2003   Social History   Socioeconomic History   Marital status: Married    Spouse name: Not on file   Number of children: Not on file   Years of education: Not on file   Highest education level: Not on file  Occupational History   Not on file  Tobacco Use   Smoking status: Never   Smokeless tobacco: Never  Substance and Sexual Activity   Alcohol use: Yes    Comment: RARE   Drug use: No   Sexual activity: Not on file  Other Topics Concern   Not on file  Social History Narrative   Not on file   Social Determinants of Health   Financial Resource Strain: Not on file  Food Insecurity: Not on file  Transportation Needs: Not on file  Physical Activity: Not on file  Stress: Not on file  Social Connections: Not on file   Allergies  Allergen Reactions   Iodine Shortness Of Breath   Pentazocine Lactate Other (See Comments)    HALLUCINATION   Shellfish Allergy Other (See Comments)    RESPIRATORY DISTRESS/ TACHYCARDIA   Sulfonamide Derivatives Swelling    REACTION: face swells   Citrus Rash   Family History  Problem Relation Age of Onset   Colon polyps Mother    Heart disease Mother     Current  Outpatient Medications (Endocrine & Metabolic):    glipiZIDE (GLUCOTROL) 10 MG tablet, Take 10 mg by mouth 2 (two) times daily before a meal.   metFORMIN (GLUCOPHAGE) 500 MG tablet, Take 500 mg by mouth 2 (two) times daily with a meal.  Current Outpatient Medications (Cardiovascular):    atorvastatin (LIPITOR) 10 MG tablet, Take 10 mg by mouth every morning.   losartan (COZAAR) 25 MG tablet, Take 25 mg by mouth every morning.  Current Outpatient Medications (Respiratory):    benzonatate (TESSALON) 100 MG capsule, Take 1-2 capsules (100-200 mg total) 3 (three) times daily as needed by mouth for cough. May take 200mg  per dose as needed.   HYDROcodone-homatropine (HYCODAN) 5-1.5 MG/5ML syrup, Take 5 mLs every 8 (eight) hours as needed by mouth for  cough.  Current Outpatient Medications (Analgesics):    aspirin 81 MG tablet, Take 81 mg by mouth daily.   ibuprofen (ADVIL,MOTRIN) 200 MG tablet, Take 200 mg by mouth every 6 (six) hours as needed.  Current Outpatient Medications (Hematological):    Cyanocobalamin (VITAMIN B 12 PO), Take by mouth.   vitamin B-12 (CYANOCOBALAMIN) 1000 MCG tablet, Take 1,000 mcg by mouth daily.  Current Outpatient Medications (Other):    azithromycin (ZITHROMAX) 250 MG tablet, Take 2 tabs PO x 1 dose, then 1 tab PO QD x 4 days   Calcium-Magnesium-Vitamin D 600-40-500 MG-MG-UNIT TB24, Take 1 tablet by mouth daily.   cholecalciferol (VITAMIN D) 1000 UNITS tablet, Take 1,000 Units by mouth daily.   cyclobenzaprine (FLEXERIL) 10 MG tablet, Take 10 mg by mouth every evening.   diphenhydrAMINE (SOMINEX) 25 MG tablet, Take 25 mg by mouth at bedtime as needed. Reported on 04/01/2015   hydroxychloroquine (PLAQUENIL) 200 MG tablet, Take 200 mg by mouth 2 (two) times daily.   Multiple Vitamin (MULTIVITAMIN) tablet, Take 1 tablet by mouth daily.   Omega-3 Fatty Acids (FISH OIL PO), Take by mouth daily.   omeprazole (PRILOSEC) 20 MG capsule, Take 20 mg by mouth every morning.   vitamin C (ASCORBIC ACID) 500 MG tablet, Take 500 mg by mouth daily.   Reviewed prior external information including notes and imaging from  primary care provider As well as notes that were available from care everywhere and other healthcare systems.  Past medical history, social, surgical and family history all reviewed in electronic medical record.  No pertanent information unless stated regarding to the chief complaint.   Review of Systems:  No headache, visual changes, nausea, vomiting, diarrhea, constipation, dizziness, abdominal pain, skin rash, fevers, chills, night sweats, weight loss, swollen lymph nodes, body aches, joint swelling, chest pain, shortness of breath, mood changes. POSITIVE muscle aches  Objective  There were no vitals  taken for this visit.   General: No apparent distress alert and oriented x3 mood and affect normal, dressed appropriately.  HEENT: Pupils equal, extraocular movements intact  Respiratory: Patient's speak in full sentences and does not appear short of breath  Cardiovascular: No lower extremity edema, non tender, no erythema      Impression and Recommendations:

## 2021-12-13 ENCOUNTER — Ambulatory Visit: Payer: BC Managed Care – PPO | Admitting: Family Medicine

## 2022-07-25 ENCOUNTER — Other Ambulatory Visit: Payer: Self-pay

## 2022-07-25 ENCOUNTER — Encounter: Payer: Self-pay | Admitting: Family Medicine

## 2022-07-25 ENCOUNTER — Ambulatory Visit (INDEPENDENT_AMBULATORY_CARE_PROVIDER_SITE_OTHER): Payer: BC Managed Care – PPO

## 2022-07-25 ENCOUNTER — Ambulatory Visit (INDEPENDENT_AMBULATORY_CARE_PROVIDER_SITE_OTHER): Payer: BC Managed Care – PPO | Admitting: Family Medicine

## 2022-07-25 VITALS — BP 124/80 | HR 89 | Ht 65.0 in | Wt 172.0 lb

## 2022-07-25 DIAGNOSIS — S63592D Other specified sprain of left wrist, subsequent encounter: Secondary | ICD-10-CM | POA: Diagnosis not present

## 2022-07-25 DIAGNOSIS — L089 Local infection of the skin and subcutaneous tissue, unspecified: Secondary | ICD-10-CM

## 2022-07-25 DIAGNOSIS — M79644 Pain in right finger(s): Secondary | ICD-10-CM

## 2022-07-25 MED ORDER — DOXYCYCLINE HYCLATE 100 MG PO TABS: 100.0000 mg | ORAL_TABLET | Freq: Two times a day (BID) | ORAL | 0 refills | Status: DC

## 2022-07-25 MED ORDER — DOXYCYCLINE HYCLATE 100 MG PO TABS
100.0000 mg | ORAL_TABLET | Freq: Two times a day (BID) | ORAL | 0 refills | Status: DC
Start: 2022-07-25 — End: 2022-07-25

## 2022-07-25 NOTE — Patient Instructions (Addendum)
Doxy 100mg   2x a day for 14 days  Go get that nail off  Xrays on the way out 2 week follow up

## 2022-07-25 NOTE — Progress Notes (Signed)
[name] [address] [place] [address] Phone: [phone] Subjective:    I'm seeing this patient by the request  of:  System, Provider Not In  CC: Right finger swelling  [id number]  Sarah Sanford is a 62 y.o. female coming in with complaint of finger injury. Last seen for wrist injury in September 2023. Patient states thinks her finger is infected. Slammed her finger in between a ping pong table her nail cracked. She went the nail salon and an acrylic  tip was added to the finger. She has swelling and puss come out of the finger.       Past Medical History:  Diagnosis Date   Antiphospholipid syndrome (HCC)    Anxiety    Chronic back pain HX  FX TAILBONE   Complication of anesthesia HARD TO WAKE   Diabetes mellitus    Fibromyalgia    GERD (gastroesophageal reflux disease)    History of Bell's palsy BILATERAL X4 IN 2006-- RESOLVED   History of lupus HX POSITIVE BLOODWORK-- IN REMISSION   Hyperlipidemia    Hypertension    IBS (irritable bowel syndrome)    Migraines    Normal cardiac stress test 6 YRS AGO   RA (rheumatoid arthritis) (HCC)    Sjoegren syndrome    Past Surgical History:  Procedure Laterality Date   CERVICAL CERCLAGE  X2   CERVICAL CONIZATION W/BX  1989   EXAMINATION UNDER ANESTHESIA  03/30/2011   Procedure: EXAM UNDER ANESTHESIA;  Surgeon: Kathi Ludwig, MD;  Location: [facility];  Service: Urology;  Laterality: N/A;  lysis of vaginal scar   RIGHT PERIURETHRAL EXPLORATION EXCISION MENTOR TRANSOBTURATOR TAPE/ PLACEMENT XENFORM  11-03-2010   VAGINAL EXPL. EXCISION RIGHT PERIURTHRAL MENTOR TAPE  07-26-2007   VAGINAL HYSTERECTOMY / BSO/ IMPLANT MENTOR PUBOVAGINAL SLING  05-11-2003   Social History   Socioeconomic History   Marital status: Married    Spouse name: Not on file   Number of children: Not on file   Years of education: Not on file   Highest education level: Not on file   Occupational History   Not on file  Tobacco Use   Smoking status: Never   Smokeless tobacco: Never  Substance and Sexual Activity   Alcohol use: Yes    Comment: RARE   Drug use: No   Sexual activity: Not on file  Other Topics Concern   Not on file  Social History Narrative   Not on file   Social Determinants of Health   Financial Resource Strain: Not on file  Food Insecurity: Not on file  Transportation Needs: Not on file  Physical Activity: Not on file  Stress: Not on file  Social Connections: Not on file   Allergies  Allergen Reactions   Iodine Shortness Of Breath   Pentazocine Lactate Other (See Comments)    HALLUCINATION   Shellfish Allergy Other (See Comments)    RESPIRATORY DISTRESS/ TACHYCARDIA   Sulfonamide Derivatives Swelling    REACTION: face swells   Citrus Rash   Family History  Problem Relation Age of Onset   Colon polyps Mother    Heart disease Mother     Current Outpatient Medications (Endocrine & Metabolic):    glipiZIDE (GLUCOTROL) 10 MG tablet, Take 10 mg by mouth 2 (two) times daily before a meal.   metFORMIN (GLUCOPHAGE) 500 MG tablet, Take 500 mg by mouth 2 (two) times daily with a meal.  Current Outpatient Medications (Cardiovascular):  atorvastatin (LIPITOR) 10 MG tablet, Take 10 mg by mouth every morning.   losartan (COZAAR) 25 MG tablet, Take 25 mg by mouth every morning.  Current Outpatient Medications (Respiratory):    benzonatate (TESSALON) 100 MG capsule, Take 1-2 capsules (100-200 mg total) 3 (three) times daily as needed by mouth for cough. May take 200mg  per dose as needed.   HYDROcodone-homatropine (HYCODAN) 5-1.5 MG/5ML syrup, Take 5 mLs every 8 (eight) hours as needed by mouth for cough.  Current Outpatient Medications (Analgesics):    aspirin 81 MG tablet, Take 81 mg by mouth daily.   ibuprofen (ADVIL,MOTRIN) 200 MG tablet, Take 200 mg by mouth every 6 (six) hours as needed.  Current Outpatient Medications  (Hematological):    Cyanocobalamin (VITAMIN B 12 PO), Take by mouth.   vitamin B-12 (CYANOCOBALAMIN) 1000 MCG tablet, Take 1,000 mcg by mouth daily.  Current Outpatient Medications (Other):    Calcium-Magnesium-Vitamin D 600-40-500 MG-MG-UNIT TB24, Take 1 tablet by mouth daily.   cholecalciferol (VITAMIN D) 1000 UNITS tablet, Take 1,000 Units by mouth daily.   cyclobenzaprine (FLEXERIL) 10 MG tablet, Take 10 mg by mouth every evening.   Multiple Vitamin (MULTIVITAMIN) tablet, Take 1 tablet by mouth daily.   Omega-3 Fatty Acids (FISH OIL PO), Take by mouth daily.   omeprazole (PRILOSEC) 20 MG capsule, Take 20 mg by mouth every morning.   vitamin C (ASCORBIC ACID) 500 MG tablet, Take 500 mg by mouth daily.   azithromycin (ZITHROMAX) 250 MG tablet, Take 2 tabs PO x 1 dose, then 1 tab PO QD x 4 days   diphenhydrAMINE (SOMINEX) 25 MG tablet, Take 25 mg by mouth at bedtime as needed. Reported on 04/01/2015   doxycycline (VIBRA-TABS) 100 MG tablet, Take 1 tablet (100 mg total) by mouth 2 (two) times daily.   hydroxychloroquine (PLAQUENIL) 200 MG tablet, Take 200 mg by mouth 2 (two) times daily.   Reviewed prior external information including notes and imaging from  primary care provider As well as notes that were available from care everywhere and other healthcare systems.  Past medical history, social, surgical and family history all reviewed in electronic medical record.  No pertanent information unless stated regarding to the chief complaint.   Review of Systems:  No headache, visual changes, nausea, vomiting, diarrhea, constipation, dizziness, abdominal pain, skin rash, fevers, chills, night sweats, weight loss, swollen lymph nodes, body aches, , chest pain, shortness of breath, mood changes. POSITIVE muscle aches, joint swelling  Objective  Blood pressure 124/80, pulse 89, height 5\' 5"  (1.651 m), weight 172 lb (78 kg), SpO2 100 %.   General: No apparent distress alert and oriented x3 mood  and affect normal, dressed appropriately.  HEENT: Pupils equal, extraocular movements intact  Respiratory: Patient's speak in full sentences and does not appear short of breath  Cardiovascular: No lower extremity edema, non tender, no erythema  Right DIP finger of the index finger significantly red erythema severe tenderness to palpation noted.  Does have full flexion noted.  Limited muscular skeletal ultrasound was performed and interpreted by Antoine Primas, M   Limited ultrasound shows that there is significant hypoechoic changes and increasing Doppler flow in the soft tissue consistent with a cellulitis noted.  Does not appear to go into the cortex of the underlying bone but difficult to assess secondary to limitation on the ultrasound. Impression: Likely abscess with cellulitic formation of the finger seems to be extra-articular    Impression and Recommendations:     The above documentation  has been reviewed and is accurate and complete Lyndal Pulley, DO

## 2022-07-25 NOTE — Assessment & Plan Note (Signed)
Patient does have what appears to be more of a finger infection noted.  Discussed with patient about icing regimen, started on doxycycline.  Discussed having the nail removed.  Discussed draining of any type of abscess.  Will see how patient responds to the antibiotics.  Patient knows if any streaking to seek medical attention immediately.  Follow-up with me again in 2 weeks

## 2022-08-01 ENCOUNTER — Encounter: Payer: Self-pay | Admitting: Family Medicine

## 2022-08-02 NOTE — Progress Notes (Signed)
[facility] [address] [place] [address] Phone: [phone] Subjective:   Sarah Sanford, am serving as a scribe for Dr. Antoine Primas.  I'm seeing this patient by the request  of:  System, Provider Not In  CC: right index finger   [id number]  07/25/2022 Patient does have what appears to be more of a finger infection noted.  Discussed with patient about icing regimen, started on doxycycline.  Discussed having the nail removed.  Discussed draining of any type of abscess.  Will see how patient responds to the antibiotics.  Patient knows if any streaking to seek medical attention immediately.  Follow-up with me again in 2 weeks     Update 08/10/2022 Sarah Sanford is a 62 y.o. female coming in with complaint of R index finger pain. Patient states that her finger is doing well the nail came off and the old skin sloughed off. Tenderness is almost gone from the fresh skin growing and only hurts really bad if she accidentally hits in on things.   Xray R hand 07/25/2022 IMPRESSION: 1. No acute fracture. 2. Mild index finger PIP osteoarthritis.     Past Medical History:  Diagnosis Date   Antiphospholipid syndrome (HCC)    Anxiety    Chronic back pain HX  FX TAILBONE   Complication of anesthesia HARD TO WAKE   Diabetes mellitus    Fibromyalgia    GERD (gastroesophageal reflux disease)    History of Bell's palsy BILATERAL X4 IN 2006-- RESOLVED   History of lupus HX POSITIVE BLOODWORK-- IN REMISSION   Hyperlipidemia    Hypertension    IBS (irritable bowel syndrome)    Migraines    Normal cardiac stress test 6 YRS AGO   RA (rheumatoid arthritis) (HCC)    Sjoegren syndrome    Past Surgical History:  Procedure Laterality Date   CERVICAL CERCLAGE  X2   CERVICAL CONIZATION W/BX  1989   EXAMINATION UNDER ANESTHESIA  03/30/2011   Procedure: EXAM UNDER ANESTHESIA;  Surgeon: Kathi Ludwig, MD;  Location: [facility];   Service: Urology;  Laterality: N/A;  lysis of vaginal scar   RIGHT PERIURETHRAL EXPLORATION EXCISION MENTOR TRANSOBTURATOR TAPE/ PLACEMENT XENFORM  11-03-2010   VAGINAL EXPL. EXCISION RIGHT PERIURTHRAL MENTOR TAPE  07-26-2007   VAGINAL HYSTERECTOMY / BSO/ IMPLANT MENTOR PUBOVAGINAL SLING  05-11-2003   Social History   Socioeconomic History   Marital status: Married    Spouse name: Not on file   Number of children: Not on file   Years of education: Not on file   Highest education level: Not on file  Occupational History   Not on file  Tobacco Use   Smoking status: Never   Smokeless tobacco: Never  Substance and Sexual Activity   Alcohol use: Yes    Comment: RARE   Drug use: No   Sexual activity: Not on file  Other Topics Concern   Not on file  Social History Narrative   Not on file   Social Determinants of Health   Financial Resource Strain: Low Risk  (09/17/2020)   Received from [facility] visits prior to 04/01/2022.   Overall Financial Resource Strain (CARDIA)    Difficulty of Paying Living Expenses: Not hard at all  Food Insecurity: No Food Insecurity (09/17/2020)   Received from [facility] visits prior to 04/01/2022.   Hunger Vital Sign    Worried About Running Out of Food  in the Last Year: Never true    Ran Out of Food in the Last Year: Never true  Transportation Needs: No Transportation Needs (09/17/2020)   Received from [facility] visits prior to 04/01/2022.   PRAPARE - Administrator, Civil Service (Medical): No    Lack of Transportation (Non-Medical): No  Physical Activity: Insufficiently Active (09/17/2020)   Received from [facility] visits prior to 04/01/2022.   Exercise Vital Sign    Days of Exercise per Week: 3 days    Minutes of Exercise per Session: 40 min  Stress: Stress Concern Present (09/17/2020)   Received from [facility] visits prior to  04/01/2022.   [employer] of Occupational Health - Occupational Stress Questionnaire    Feeling of Stress : To some extent  Social Connections: Socially Integrated (09/17/2020)   Received from [facility] visits prior to 04/01/2022.   Social Advertising account executive [NHANES]    Frequency of Communication with Friends and Family: More than three times a week    Frequency of Social Gatherings with Friends and Family: Twice a week    Attends Religious Services: 1 to 4 times per year    Active Member of [employer] or Organizations: Yes    Attends Engineer, structural: More than 4 times per year    Marital Status: Married   Allergies  Allergen Reactions   Iodine Shortness Of Breath   Pentazocine Lactate Other (See Comments)    HALLUCINATION   Shellfish Allergy Other (See Comments)    RESPIRATORY DISTRESS/ TACHYCARDIA   Sulfonamide Derivatives Swelling    REACTION: face swells   Citrus Rash   Family History  Problem Relation Age of Onset   Colon polyps Mother    Heart disease Mother     Current Outpatient Medications (Endocrine & Metabolic):    glipiZIDE (GLUCOTROL) 10 MG tablet, Take 10 mg by mouth 2 (two) times daily before a meal.   metFORMIN (GLUCOPHAGE) 500 MG tablet, Take 500 mg by mouth 2 (two) times daily with a meal.   OZEMPIC, 1 MG/DOSE, 4 MG/3ML SOPN, Inject 1 mg as directed daily.  Current Outpatient Medications (Cardiovascular):    atorvastatin (LIPITOR) 10 MG tablet, Take 10 mg by mouth every morning.   losartan (COZAAR) 25 MG tablet, Take 25 mg by mouth every morning.  Current Outpatient Medications (Respiratory):    benzonatate (TESSALON) 100 MG capsule, Take 1-2 capsules (100-200 mg total) 3 (three) times daily as needed by mouth for cough. May take 200mg  per dose as needed.   HYDROcodone-homatropine (HYCODAN) 5-1.5 MG/5ML syrup, Take 5 mLs every 8 (eight) hours as needed by mouth for cough.  Current Outpatient Medications  (Analgesics):    aspirin 81 MG tablet, Take 81 mg by mouth daily.   ibuprofen (ADVIL,MOTRIN) 200 MG tablet, Take 200 mg by mouth every 6 (six) hours as needed.  Current Outpatient Medications (Hematological):    Cyanocobalamin (VITAMIN B 12 PO), Take by mouth.   vitamin B-12 (CYANOCOBALAMIN) 1000 MCG tablet, Take 1,000 mcg by mouth daily.  Current Outpatient Medications (Other):    Calcium-Magnesium-Vitamin D 600-40-500 MG-MG-UNIT TB24, Take 1 tablet by mouth daily.   cholecalciferol (VITAMIN D) 1000 UNITS tablet, Take 1,000 Units by mouth daily.   cyclobenzaprine (FLEXERIL) 10 MG tablet, Take 10 mg by mouth every evening.   Multiple Vitamin (MULTIVITAMIN) tablet, Take 1 tablet by mouth daily.   Omega-3 Fatty Acids (  FISH OIL PO), Take by mouth daily.   omeprazole (PRILOSEC) 20 MG capsule, Take 20 mg by mouth every morning.   vitamin C (ASCORBIC ACID) 500 MG tablet, Take 500 mg by mouth daily.   azithromycin (ZITHROMAX) 250 MG tablet, Take 2 tabs PO x 1 dose, then 1 tab PO QD x 4 days   diphenhydrAMINE (SOMINEX) 25 MG tablet, Take 25 mg by mouth at bedtime as needed. Reported on 04/01/2015   doxycycline (VIBRA-TABS) 100 MG tablet, Take 1 tablet (100 mg total) by mouth 2 (two) times daily.   hydroxychloroquine (PLAQUENIL) 200 MG tablet, Take 200 mg by mouth 2 (two) times daily.   Reviewed prior external information including notes and imaging from  primary care provider As well as notes that were available from care everywhere and other healthcare systems.  Past medical history, social, surgical and family history all reviewed in electronic medical record.  No pertanent information unless stated regarding to the chief complaint.   Review of Systems:  No headache, visual changes, nausea, vomiting, diarrhea, constipation, dizziness, abdominal pain, skin rash, fevers, chills, night sweats, weight loss, swollen lymph nodes, body aches, joint swelling, chest pain, shortness of breath, mood  changes. POSITIVE muscle aches  Objective  Blood pressure 130/80, pulse 92, height 5\' 5"  (1.651 m), weight 174 lb (78.9 kg), SpO2 98%.   General: No apparent distress alert and oriented x3 mood and affect normal, dressed appropriately.  HEENT: Pupils equal, extraocular movements intact  Respiratory: Patient's speak in full sentences and does not appear short of breath  Cardiovascular: No lower extremity edema, non tender, no erythema  Wrist exam shows good range of motion noted.  No significant difficulty.  Finger exam right index shows that there is still some mild redness but does seem to have significant improvement from previous exam.   Limited muscular skeletal ultrasound was performed and interpreted by Antoine Primas, M   Limited ultrasound does not have any increase in Doppler flow as what was seen previously.  Patient does have some still hypoechoic changes in the soft tissue but seems to be a resolving abscess.  No significant findings in the soft tissue at this time. Impression: Interval improvement    Impression and Recommendations:    The above documentation has been reviewed and is accurate and complete Judi Saa, DO

## 2022-08-10 ENCOUNTER — Other Ambulatory Visit: Payer: Self-pay

## 2022-08-10 ENCOUNTER — Encounter: Payer: Self-pay | Admitting: Family Medicine

## 2022-08-10 ENCOUNTER — Ambulatory Visit (INDEPENDENT_AMBULATORY_CARE_PROVIDER_SITE_OTHER): Payer: BC Managed Care – PPO | Admitting: Family Medicine

## 2022-08-10 VITALS — BP 130/80 | HR 92 | Ht 65.0 in | Wt 174.0 lb

## 2022-08-10 DIAGNOSIS — M79644 Pain in right finger(s): Secondary | ICD-10-CM

## 2022-08-10 DIAGNOSIS — L089 Local infection of the skin and subcutaneous tissue, unspecified: Secondary | ICD-10-CM | POA: Diagnosis not present

## 2022-08-10 MED ORDER — DOXYCYCLINE HYCLATE 100 MG PO TABS: 100.0000 mg | ORAL_TABLET | Freq: Two times a day (BID) | ORAL | 0 refills | Status: AC

## 2022-08-10 NOTE — Assessment & Plan Note (Signed)
Significant improvement noted.  Does have some underlying arthritic changes noted.  We did give patient another 2 weeks of the doxycycline.  Patient would like to do a wait-and-see on the prescription.  If patient continues to do well we can continue to monitor.  If worsening she will take the doxycycline and then we will further evaluate in 2 to 4-week term.  If continuing to come back advanced imaging may be warranted to make sure there is no osteomyelitis but with patient's improvement I think this is a low likelihood.

## 2022-08-10 NOTE — Patient Instructions (Signed)
Good to see you Another 2 weeks of doxycycline sent just in case Keep doing what you are doing  Try to stay off of it for now if redness worsens do another 2 weeks of doxycycline Send [place] a message in two weeks to let us know how you are doing See me when you need me

## 2024-01-09 ENCOUNTER — Telehealth: Payer: Self-pay

## 2024-01-09 NOTE — Telephone Encounter (Signed)
 RN attempted to call patient to schedule colonoscopy for which she is overdue; left vm informing patient of being overdue per MD recommendation as previously communicated with patient; requested a call back to schedule.

## 2024-01-28 NOTE — Telephone Encounter (Signed)
 Attempted to reach patient concerning colonoscopy recall; unable to speak with patient;  left message and number to the office for patient to call back and schedule appts;
